# Patient Record
Sex: Female | Born: 1960 | Race: Black or African American | Hispanic: No | Marital: Single | Smoking: Current every day smoker
Health system: Southern US, Community
[De-identification: ages and names within clinical notes are randomized; demographics above are authoritative.]

## PROBLEM LIST (undated history)

## (undated) DIAGNOSIS — E559 Vitamin D deficiency, unspecified: Secondary | ICD-10-CM

## (undated) DIAGNOSIS — Z8619 Personal history of other infectious and parasitic diseases: Secondary | ICD-10-CM

## (undated) DIAGNOSIS — I10 Essential (primary) hypertension: Secondary | ICD-10-CM

## (undated) DIAGNOSIS — E785 Hyperlipidemia, unspecified: Secondary | ICD-10-CM

## (undated) HISTORY — DX: Vitamin D deficiency, unspecified: E55.9

## (undated) HISTORY — DX: Hyperlipidemia, unspecified: E78.5

## (undated) HISTORY — DX: Personal history of other infectious and parasitic diseases: Z86.19

---

## 2006-08-25 HISTORY — PX: ABDOMINAL HYSTERECTOMY: SHX81

## 2009-09-23 LAB — HM PAP SMEAR: HM Pap smear: NORMAL

## 2009-09-23 LAB — HM DEXA SCAN: HM Dexa Scan: NORMAL

## 2010-08-31 LAB — PULMONARY FUNCTION TEST

## 2011-09-22 ENCOUNTER — Emergency Department (INDEPENDENT_AMBULATORY_CARE_PROVIDER_SITE_OTHER): Payer: Managed Care, Other (non HMO)

## 2011-09-22 ENCOUNTER — Encounter (HOSPITAL_BASED_OUTPATIENT_CLINIC_OR_DEPARTMENT_OTHER): Payer: Self-pay | Admitting: *Deleted

## 2011-09-22 ENCOUNTER — Emergency Department (HOSPITAL_BASED_OUTPATIENT_CLINIC_OR_DEPARTMENT_OTHER)
Admission: EM | Admit: 2011-09-22 | Discharge: 2011-09-22 | Disposition: A | Discharge status: Home / Self Care | Payer: Managed Care, Other (non HMO) | Attending: Emergency Medicine | Admitting: Emergency Medicine

## 2011-09-22 DIAGNOSIS — S6290XA Unspecified fracture of unspecified wrist and hand, initial encounter for closed fracture: Secondary | ICD-10-CM | POA: Insufficient documentation

## 2011-09-22 DIAGNOSIS — S6990XA Unspecified injury of unspecified wrist, hand and finger(s), initial encounter: Secondary | ICD-10-CM

## 2011-09-22 DIAGNOSIS — S62609A Fracture of unspecified phalanx of unspecified finger, initial encounter for closed fracture: Secondary | ICD-10-CM

## 2011-09-22 DIAGNOSIS — W208XXA Other cause of strike by thrown, projected or falling object, initial encounter: Secondary | ICD-10-CM

## 2011-09-22 DIAGNOSIS — M79609 Pain in unspecified limb: Secondary | ICD-10-CM

## 2011-09-22 DIAGNOSIS — IMO0002 Reserved for concepts with insufficient information to code with codable children: Secondary | ICD-10-CM | POA: Insufficient documentation

## 2011-09-22 DIAGNOSIS — F17200 Nicotine dependence, unspecified, uncomplicated: Secondary | ICD-10-CM | POA: Insufficient documentation

## 2011-09-22 DIAGNOSIS — I10 Essential (primary) hypertension: Secondary | ICD-10-CM | POA: Insufficient documentation

## 2011-09-22 HISTORY — DX: Essential (primary) hypertension: I10

## 2011-09-22 MED ORDER — HYDROCHLOROTHIAZIDE 25 MG PO TABS
ORAL_TABLET | ORAL | Status: AC
  Filled 2011-09-22: qty 1

## 2011-09-22 MED ORDER — HYDROCODONE-ACETAMINOPHEN 5-325 MG PO TABS
2.0000 | ORAL_TABLET | Freq: Once | ORAL | Status: AC
  Administered 2011-09-22: 2 via ORAL

## 2011-09-22 MED ORDER — HYDROCODONE-ACETAMINOPHEN 5-325 MG PO TABS
ORAL_TABLET | ORAL | Status: AC
  Filled 2011-09-22: qty 2

## 2011-09-22 MED ORDER — HYDROCODONE-ACETAMINOPHEN 5-325 MG PO TABS: 2.0000 | ORAL_TABLET | Freq: Once | ORAL | Status: AC

## 2011-09-22 MED ORDER — HYDROCHLOROTHIAZIDE 25 MG PO TABS
25.0000 mg | ORAL_TABLET | Freq: Once | ORAL | Status: AC
  Administered 2011-09-22: 25 mg via ORAL

## 2011-09-22 MED ORDER — HYDROCHLOROTHIAZIDE 25 MG PO TABS: 25.0000 mg | ORAL_TABLET | Freq: Once | ORAL | Status: DC

## 2011-09-22 NOTE — ED Provider Notes (Signed)
History     CSN: [id number]  Arrival date & time 09/22/11  1712   First MD Initiated Contact with Patient 09/22/11 1739      No chief complaint on file.   (Consider location/radiation/quality/duration/timing/severity/associated sxs/prior treatment) Patient is a 51 y.o. female presenting with hand injury. The history is provided by the patient. No language interpreter was used.  Hand Injury  The incident occurred yesterday. The incident occurred at home. The injury mechanism was a direct blow. The pain is present in the right fingers and left fingers. The quality of the pain is described as throbbing. The pain is at a severity of 5/10. The pain is moderate. The pain has been constant since the incident. She reports no foreign bodies present. The symptoms are aggravated by movement. She has tried nothing for the symptoms.  Pt reports she slammed the car trunk on both of her hands.  Pt complains of pain in left and right fingers  Past Medical History  Diagnosis Date  . Hypertension     Past Surgical History  Procedure Date  . Abdominal hysterectomy     History reviewed. No pertinent family history.  History  Substance Use Topics  . Smoking status: Current Everyday Smoker  . Smokeless tobacco: Not on file  . Alcohol Use: 0.6 oz/week    1 Cans of beer per week     per day    OB History    Grav Para Term Preterm Abortions TAB SAB Ect Mult Living                  Review of Systems  Musculoskeletal: Positive for joint swelling.  Skin: Negative for wound.  All other systems reviewed and are negative.    Allergies  Review of patient's allergies indicates no known allergies.  Home Medications  No current outpatient prescriptions on file.  BP 200/105  Pulse 88  Temp(Src) 98.4 F (36.9 C) (Oral)  Resp 20  SpO2 100%  Physical Exam  Nursing note and vitals reviewed. Constitutional: She appears well-developed and well-nourished.  HENT:  Head: Normocephalic and  atraumatic.  Musculoskeletal: She exhibits edema and tenderness.       Swollen right 2nd and 3rd fingers,  Left hand  Tender 2nd,3rd and 4th fingers,  From all fingers,  nv and ns intact  Neurological: She is alert.  Skin: Skin is warm and dry.    ED Course  Procedures (including critical care time)  Labs Reviewed - No data to display Dg Hand Complete Left  09/22/2011  *RADIOLOGY REPORT*  Clinical Data: Slammed both hands in trunk of car yesterday  LEFT HAND - COMPLETE 3+ VIEW  Comparison: Right hand radiographs 09/22/2011  Findings: The joints of the hand are normally aligned.  Bony mineralization is normal.  No acute or healing fracture or radiopaque foreign body.  No discrete focal soft tissue swelling identified.  No significant degenerative change.  IMPRESSION: No evidence of acute bony trauma.  Original Report Authenticated By: Britta Mccreedy, M.D.   Dg Hand Complete Right  09/22/2011  *RADIOLOGY REPORT*  Clinical Data: Slammed both hands in a car trunk.  RIGHT HAND - COMPLETE 3+ VIEW  Comparison: Left hand radiographs peri  Findings: The joints are aligned.  Best seen on the lateral view are two small bony fragments dorsal to the distal interphalangeal joint of the second digit.  The largest of these bony fragments measures 3 mm.  Findings are suspicious for acute displaced fracture fragments, likely arising  from the base of the distal phalanx.  Suggest correlation with site of pain.  Also seen on the lateral view is a single 1.5 mm bony fragment along the dorsal aspect of the third distal interphalangeal joint, which is small fracture fragment cannot be excluded, likely arising from the base of the distal phalanx.  The remainder the bones appear intact.  No radiopaque foreign body is seen.  IMPRESSION:  1.  Findings suspicious for an acute displaced fracture of the second finger at the level of the DIP joint.  Fracture fragment is displaced dorsally, and favored to arise from the base of the  distal phalanx. 2. Probable acute tiny fracture fragment (versus degenerative change) at the level of the distal interphalangeal joint of the third finger.  Original Report Authenticated By: Britta Mccreedy, M.D.     No diagnosis found.    MDM  Pt placed in a splint.  I advised follow up with Dr. Pearletha Forge for recheck in 1 week.        Langston Masker, [place] 09/22/11 1901

## 2011-09-22 NOTE — ED Provider Notes (Signed)
Medical screening examination/treatment/procedure(s) were performed by non-physician practitioner and as supervising physician I was immediately available for consultation/collaboration.   Noam Karaffa, MD 09/22/11 2311 

## 2011-09-24 ENCOUNTER — Encounter: Payer: Self-pay | Admitting: Family

## 2011-09-24 ENCOUNTER — Ambulatory Visit (INDEPENDENT_AMBULATORY_CARE_PROVIDER_SITE_OTHER): Payer: Managed Care, Other (non HMO) | Admitting: Family

## 2011-09-24 VITALS — BP 170/100 | HR 90 | Temp 98.7°F | Resp 16

## 2011-09-24 DIAGNOSIS — S62609A Fracture of unspecified phalanx of unspecified finger, initial encounter for closed fracture: Secondary | ICD-10-CM

## 2011-09-24 DIAGNOSIS — I10 Essential (primary) hypertension: Secondary | ICD-10-CM | POA: Insufficient documentation

## 2011-09-24 DIAGNOSIS — H40009 Preglaucoma, unspecified, unspecified eye: Secondary | ICD-10-CM

## 2011-09-24 LAB — BASIC METABOLIC PANEL
BUN: 14 mg/dL (ref 6–23)
Calcium: 10.3 mg/dL (ref 8.4–10.5)
Chloride: 104 mEq/L (ref 96–112)
Creat: 1.14 mg/dL — ABNORMAL HIGH (ref 0.50–1.10)

## 2011-09-24 MED ORDER — OLMESARTAN MEDOXOMIL-HCTZ 20-12.5 MG PO TABS: 1.0000 | ORAL_TABLET | Freq: Every day | ORAL | Status: DC

## 2011-09-24 NOTE — Assessment & Plan Note (Signed)
BP Readings from Last 3 Encounters:  09/24/11 170/100  09/22/11 200/105  Blood pressure is actually improved from the reading in the ED but far from goal.  Will obtain bmet today for baseline renal function and change to benicar hct.  She was instructed on a low sodium diet and is instructed to follow up in 2 weeks.

## 2011-09-24 NOTE — Assessment & Plan Note (Signed)
She is scheduled to see orthopedics and I have encouraged her to keep this upcoming appointment.

## 2011-09-24 NOTE — Progress Notes (Signed)
Subjective:    Patient ID: Tiffany May, female    DOB: 25-May-1961, 51 y.o.   MRN: [id number]  HPI  Tiffany May is a 51 yr old female who presents today to establish care.  She was seen in the ED on 1/27 due to finger injury and was found to have elevated blood pressure.   1) HTN- reports that that she was diagnosed with HTN 2 yrs ago. She has not been treated for her HTN. She was place on HCTZ by ED- took dose this AM. She tries to keep low sodium diet.  Maternal GM, mother has HTN.   2) Finger Fractures- she reports that Shut her right index, middle finger in trunk on Sunday.  She was noted to have fractures of right second/third fingers at the level of DIP joints.  She will be seeing Dr. Ocie Cornfield- orthopedics.  3)Glaucoma- She reports that this is borderline, she sees an optometrist and is having her eyes checked every 2 yrs.    4) hx of tobacco abuse- reports that she quit smoking on "sunday."  She does smoke marijuana about once a week though.  Fibroid uterus/anemia- s/p partial hysterectomy in 2008.      Review of Systems  Constitutional: Negative for unexpected weight change.  HENT: Negative for congestion.   Eyes: Negative for visual disturbance.  Respiratory: Negative for cough and shortness of breath.   Cardiovascular: Negative for chest pain and leg swelling.  Gastrointestinal: Negative for nausea, vomiting and diarrhea.  Genitourinary: Positive for frequency. Negative for dysuria, hematuria, menstrual problem and dyspareunia.  Musculoskeletal: Negative for myalgias and arthralgias.  Skin: Negative for rash.  Neurological:       Rare headaches  Hematological: Negative for adenopathy.  Psychiatric/Behavioral:       Denies depression/anxiety   Past Medical History  Diagnosis Date  . Hypertension   . History of chicken pox   . Glaucoma     History   Social History  . Marital Status: Single    Spouse Name: N/A    Number of Children: 1  . Years of Education: N/A     Occupational History  .  [employer]   Social History Main Topics  . Smoking status: Former Smoker    Quit date: 09/23/2011  . Smokeless tobacco: Not on file   Comment: trying to quit  . Alcohol Use: 0.6 oz/week    1 Cans of beer per week     per day  . Drug Use: Yes    Special: Marijuana  . Sexually Active: Yes    Birth Control/ Protection: Surgical   Other Topics Concern  . Not on file   Social History Narrative   Caffeine Use:  1 soda dailyRegular exercise:  3 x weekly, walks dogs every day.Uses marijuana on weekends.She quit smoking cigarettes yesterday.  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 Abdominal hysterectomy 2008  . Cesarean section     Family History  Problem Relation Age of Onset  . Heart disease Mother     pacemaker  . Arthritis Maternal Grandmother   . Hypertension Maternal Grandmother   . Cancer Cousin     breast  . Cancer Cousin     colon  . Cancer Cousin     colon  . Cancer Cousin     colon    No Known Allergies  Current Outpatient Prescriptions on File Prior to Visit  Medication Sig Dispense Refill  . HYDROcodone-acetaminophen (NORCO) 5-325 MG per tablet Take 2 tablets by mouth once.  10 tablet  0    BP 170/100  Pulse 90  Temp(Src) 98.7 F (37.1 C) (Oral)  Resp 16  SpO2 98%       Objective:   Physical Exam  Constitutional: She appears well-developed and well-nourished. No distress.  HENT:  Head: Normocephalic and atraumatic.  Eyes: No scleral icterus.  Cardiovascular: Normal rate and regular rhythm.   No murmur heard. Pulmonary/Chest: Effort normal and breath sounds normal. No respiratory distress. She has no wheezes. She has no rales. She exhibits no tenderness.  Abdominal: Soft. Bowel sounds are normal. She exhibits no distension. There is no tenderness. There is no rebound and no guarding.  Musculoskeletal: She exhibits no edema.       R  second/third fingers are taped into splints.    Skin: Skin is warm and dry.  Psychiatric: She has a normal mood and affect. Her behavior is normal. Judgment and thought content normal.          Assessment & Plan:  Tobacco/Marijuana abuse-  I commended her on her recent smoking cessation.  I also recommended that she discontinue use of marijuana.

## 2011-09-24 NOTE — Assessment & Plan Note (Signed)
She tells me that she is having this routinely monitored.

## 2011-09-24 NOTE — Patient Instructions (Signed)
Stop HCTZ, start benicar HCT. Please follow up in 2 weeks.

## 2011-09-26 ENCOUNTER — Encounter: Payer: Self-pay | Admitting: Family Medicine

## 2011-09-26 ENCOUNTER — Ambulatory Visit (INDEPENDENT_AMBULATORY_CARE_PROVIDER_SITE_OTHER): Payer: Managed Care, Other (non HMO) | Admitting: Family Medicine

## 2011-09-26 VITALS — BP 156/91 | Ht 64.0 in | Wt 148.0 lb

## 2011-09-26 DIAGNOSIS — S62609A Fracture of unspecified phalanx of unspecified finger, initial encounter for closed fracture: Secondary | ICD-10-CM

## 2011-09-26 NOTE — Patient Instructions (Signed)
You have avulsion fractures of your 2nd and 3rd fingers of your right hand. It's VERY important to wear the splints at all times except to wash them at the sink as I showed you. Slide them off at the sink but keep finger extended. Take hydrocodone as needed for pain but no driving on this - can take tylenol during the day instead. Ok to take aleve as needed as well. Elevate above the level of your heart as much as possible to help with swelling. Follow up with me in 1 1/2 weeks for reevaluation - we will repeat x-rays at this time with your fingers extended. Work restrictions: do not use 2nd and 3rd fingers right hand - if unable to follow these restrictions, must be out of work. Ice areas as needed for 15 minutes at a time.

## 2011-09-29 ENCOUNTER — Encounter: Payer: Self-pay | Admitting: Family Medicine

## 2011-09-29 NOTE — Progress Notes (Signed)
Subjective:    Patient ID: Tiffany May, female    DOB: 06/04/1961, 51 y.o.   MRN: [id number]  PCP: Sandford Craze  HPI 51 yo F here with right hand injury.  Patient reports on 1/27 she accidentally had right hand slammed in car trunk. Significant pain in 2nd and 3rd digits along with swelling. Pain and swelling the worst in 2nd digit. She is right handed. Placed in finger splints with DIP in extension in ED after x-rays showed distal phalanx fractures. Having a lot of difficulty working - does a lot of typing. Taking Norco for pain. No prior finger fractures/injuries.  Past Medical History  Diagnosis Date  . Hypertension   . History of chicken pox   . Glaucoma     Current Outpatient Prescriptions on File Prior to Visit  Medication Sig Dispense Refill  . HYDROcodone-acetaminophen (NORCO) 5-325 MG per tablet Take 2 tablets by mouth once.  10 tablet  0  . olmesartan-hydrochlorothiazide (BENICAR HCT) 20-12.5 MG per tablet Take 1 tablet by mouth daily.  21 tablet  0    Past Surgical History  Procedure Date  . Abdominal hysterectomy 2008  . Cesarean section     No Known Allergies  History   Social History  . Marital Status: Single    Spouse Name: N/A    Number of Children: 1  . Years of Education: N/A   Occupational History  .  [employer]   Social History Main Topics  . Smoking status: Former Smoker    Quit date: 09/23/2011  . Smokeless tobacco: Not on file   Comment: trying to quit  . Alcohol Use: 0.6 oz/week    1 Cans of beer per week     per day  . Drug Use: Yes    Special: Marijuana  . Sexually Active: Yes    Birth Control/ Protection: Surgical   Other Topics Concern  . Not on file   Social History Narrative   Caffeine Use:  1 soda dailyRegular exercise:  3 x weekly, walks dogs every day.Uses marijuana on weekends.She quit smoking cigarettes yesterday.  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 Heart disease Mother     pacemaker  . Arthritis Maternal Grandmother   . Hypertension Maternal Grandmother   . Cancer Cousin     breast  . Cancer Cousin     colon  . Cancer Cousin     colon  . Cancer Cousin     colon    BP 156/91  Ht 5\' 4"  (1.626 m)  Wt 148 lb (67.132 kg)  BMI 25.40 kg/m2  Review of Systems See HPI above.    Objective:   Physical Exam Gen: NAD  R hand: Mod swelling and bruising distal 2nd digit, less swelling and no bruising 3rd digit. No malrotation or angulation. No subungual hematoma, lacerations. TTP dorsal aspect at DIP of 2nd and 3rd digits.  No other TTP throughout digits. Collateral ligaments intact of PIP and DIP joints. Able to flex and extend against resistance at PIP, DIP joints of 2nd and 3rd digits - pain on extension of DIP both digits. NVI distally with < 2 sec cap refill.     Assessment & Plan:  1. Right 2nd and 3rd distal phalanx fractures - radiographs reviewed, confirmed distal phalanx fractures.  2nd phalanx fracture fairly large but minimally displaced.  Start with conservative care for both with  mallet finger protocol.  Keep DIP in extension for next 6 weeks then nighttime splinting for 2 more weeks.  F/u at 2 week intervals to reevaluate, repeat radiographs.  Must keep DIP in extension for imaging as well.  Ok to return to work but must not use 2nd and 3rd digits of right hand - likely to have these restrictions for 6 weeks.  Rx vicodin to take as needed for pain - no driving or working on this med.  See instructions for further.

## 2011-09-29 NOTE — Assessment & Plan Note (Signed)
Right 2nd and 3rd distal phalanx fractures - radiographs reviewed, confirmed distal phalanx fractures.  2nd phalanx fracture fairly large but minimally displaced.  Start with conservative care for both with mallet finger protocol.  Keep DIP in extension for next 6 weeks then nighttime splinting for 2 more weeks.  F/u at 2 week intervals to reevaluate, repeat radiographs.  Must keep DIP in extension for imaging as well.  Ok to return to work but must not use 2nd and 3rd digits of right hand - likely to have these restrictions for 6 weeks.  Rx vicodin to take as needed for pain - no driving or working on this med.  See instructions for further.

## 2011-10-08 ENCOUNTER — Encounter: Payer: Self-pay | Admitting: Family

## 2011-10-08 ENCOUNTER — Ambulatory Visit (HOSPITAL_BASED_OUTPATIENT_CLINIC_OR_DEPARTMENT_OTHER)
Admission: RE | Admit: 2011-10-08 | Discharge: 2011-10-08 | Disposition: A | Discharge status: Home / Self Care | Payer: Managed Care, Other (non HMO) | Source: Ambulatory Visit | Attending: Family Medicine | Admitting: Family Medicine

## 2011-10-08 ENCOUNTER — Ambulatory Visit (INDEPENDENT_AMBULATORY_CARE_PROVIDER_SITE_OTHER): Payer: Managed Care, Other (non HMO) | Admitting: Family Medicine

## 2011-10-08 ENCOUNTER — Ambulatory Visit (INDEPENDENT_AMBULATORY_CARE_PROVIDER_SITE_OTHER): Payer: Managed Care, Other (non HMO) | Admitting: Family

## 2011-10-08 ENCOUNTER — Encounter: Payer: Self-pay | Admitting: Family Medicine

## 2011-10-08 VITALS — BP 152/90 | HR 81 | Temp 97.6°F | Ht 65.0 in | Wt 148.0 lb

## 2011-10-08 VITALS — BP 148/86 | HR 82 | Temp 98.6°F | Resp 18 | Wt 149.1 lb

## 2011-10-08 DIAGNOSIS — Z4789 Encounter for other orthopedic aftercare: Secondary | ICD-10-CM | POA: Insufficient documentation

## 2011-10-08 DIAGNOSIS — S62609A Fracture of unspecified phalanx of unspecified finger, initial encounter for closed fracture: Secondary | ICD-10-CM

## 2011-10-08 DIAGNOSIS — I10 Essential (primary) hypertension: Secondary | ICD-10-CM

## 2011-10-08 DIAGNOSIS — X58XXXA Exposure to other specified factors, initial encounter: Secondary | ICD-10-CM

## 2011-10-08 DIAGNOSIS — S62639A Displaced fracture of distal phalanx of unspecified finger, initial encounter for closed fracture: Secondary | ICD-10-CM

## 2011-10-08 DIAGNOSIS — Z09 Encounter for follow-up examination after completed treatment for conditions other than malignant neoplasm: Secondary | ICD-10-CM

## 2011-10-08 MED ORDER — OLMESARTAN-AMLODIPINE-HCTZ 20-5-12.5 MG PO TABS: 1.0000 | ORAL_TABLET | Freq: Every day | ORAL | Status: DC

## 2011-10-08 NOTE — Progress Notes (Signed)
  Subjective:    Patient ID: Tiffany May, female    DOB: [birth year], 51 y.o.   MRN: [id number]  HPI  Ms.  May presents today for followup of hypertension. Patient has been treated for Chronic HTN for quite some time. She is currently on benicar HCT. No associated S/S related to HTN.   Quality: chronic Modifying factor: meds Duration: Quite sometime Associated S/S: None.  The patient denies the following associated symptoms: Chest pain, dyspnea, blurred vision, headache, or lower extremity edema.      Review of Systems See HPI  Past Medical History  Diagnosis Date  . Hypertension   . History of chicken pox   . Glaucoma     History   Social History  . Marital Status: Single    Spouse Name: N/A    Number of Children: 1  . Years of Education: N/A   Occupational History  .  [employer]   Social History Main Topics  . Smoking status: Former Smoker    Quit date: 09/23/2011  . Smokeless tobacco: Not on file   Comment: trying to quit  . Alcohol Use: 0.6 oz/week    1 Cans of beer per week     per day  . Drug Use: Yes    Special: Marijuana  . Sexually Active: Yes    Birth Control/ Protection: Surgical   Other Topics Concern  . Not on file   Social History Narrative   Caffeine Use:  1 soda dailyRegular exercise:  3 x weekly, walks dogs every day.Uses marijuana on weekends.She quit smoking cigarettes yesterday.  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 Abdominal hysterectomy 2008  . Cesarean section     Family History  Problem Relation Age of Onset  . Heart disease Mother     pacemaker  . Arthritis Maternal Grandmother   . Hypertension Maternal Grandmother   . Cancer Cousin     breast  . Cancer Cousin     colon  . Cancer Cousin     colon  . Cancer Cousin     colon    No Known Allergies  Current Outpatient Prescriptions on File Prior to Visit  Medication Sig Dispense Refill    . olmesartan-hydrochlorothiazide (BENICAR HCT) 20-12.5 MG per tablet Take 1 tablet by mouth daily.  21 tablet  0    BP 148/86  Pulse 82  Temp(Src) 98.6 F (37 C) (Oral)  Resp 18  Wt 149 lb 1.3 oz (67.622 kg)  SpO2 96%       Objective:   Physical Exam  Constitutional: She appears well-developed and well-nourished. No distress.  Cardiovascular: Normal rate and regular rhythm.   No murmur heard. Musculoskeletal: She exhibits no edema.  Psychiatric: She has a normal mood and affect. Her behavior is normal. Judgment and thought content normal.          Assessment & Plan:

## 2011-10-08 NOTE — Assessment & Plan Note (Addendum)
BP Readings from Last 3 Encounters:  10/08/11 148/86  10/08/11 152/90  09/26/11 156/91   BP prior to addition of benicar was 170/100.  Improving, but not yet at goal Change benicar HCT to tribenzor. Follow up in 1 month.  Obtain bmet.

## 2011-10-08 NOTE — Patient Instructions (Addendum)
Please complete lab work prior to leaving. Follow up in 1 month.  

## 2011-10-09 LAB — BASIC METABOLIC PANEL
Calcium: 10.3 mg/dL (ref 8.4–10.5)
Glucose, Bld: 91 mg/dL (ref 70–99)
Potassium: 4 mEq/L (ref 3.5–5.3)
Sodium: 142 mEq/L (ref 135–145)

## 2011-10-10 ENCOUNTER — Encounter: Payer: Self-pay | Admitting: Family

## 2011-10-10 ENCOUNTER — Encounter: Payer: Self-pay | Admitting: Family Medicine

## 2011-10-10 NOTE — Assessment & Plan Note (Signed)
repeat radiographs done - no additional displacement and patient has been compliant with extension splinting.  Will continue with this and current restrictions (no use of 2nd, 3rd digits right hand) until 6 weeks out then expect nighttime splinting for 2 additional weeks.  Icing, vicodin, aleve as needed.  F/u in 2 weeks - will repeat exam and x-rays.

## 2011-10-10 NOTE — Progress Notes (Signed)
Subjective:    Patient ID: Tiffany May, female    DOB: 1961-03-25, 51 y.o.   MRN: [id number]  PCP: Sandford Craze  HPI  51 yo F here for f/u right hand injury.  2/1: Patient reports on 1/27 she accidentally had right hand slammed in car trunk. Significant pain in 2nd and 3rd digits along with swelling. Pain and swelling the worst in 2nd digit. She is right handed. Placed in finger splints with DIP in extension in ED after x-rays showed distal phalanx fractures. Having a lot of difficulty working - does a lot of typing. Taking Norco for pain. No prior finger fractures/injuries.  2/13: Patient has been compliant with extension splinting of DIPs right 2nd and 3rd digits. Taking hydrocodone for pain as well as aleve. Elevating as much as possible. Not icing. No other complaints.  Past Medical History  Diagnosis Date  . Hypertension   . History of chicken pox   . Glaucoma     Current Outpatient Prescriptions on File Prior to Visit  Medication Sig Dispense Refill  . HYDROcodone-acetaminophen (VICODIN) 5-500 MG per tablet Take 1 tablet by mouth daily as needed.      . Olmesartan-Amlodipine-HCTZ (TRIBENZOR) 20-5-12.5 MG TABS Take 1 tablet by mouth daily.  30 tablet  0  . olmesartan-hydrochlorothiazide (BENICAR HCT) 20-12.5 MG per tablet Take 1 tablet by mouth daily.  21 tablet  0    Past Surgical History  Procedure Date  . Abdominal hysterectomy 2008  . Cesarean section     No Known Allergies  History   Social History  . Marital Status: Single    Spouse Name: N/A    Number of Children: 1  . Years of Education: N/A   Occupational History  .  [employer]   Social History Main Topics  . Smoking status: Former Smoker    Quit date: 09/23/2011  . Smokeless tobacco: Not on file   Comment: trying to quit  . Alcohol Use: 0.6 oz/week    1 Cans of beer per week     per day  . Drug Use: Yes    Special: Marijuana  . Sexually Active: Yes    Birth Control/ Protection:  Surgical   Other Topics Concern  . Not on file   Social History Narrative   Caffeine Use:  1 soda dailyRegular exercise:  3 x weekly, walks dogs every day.Uses marijuana on weekends.She quit smoking cigarettes yesterday.  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 Heart disease Mother     pacemaker  . Arthritis Maternal Grandmother   . Hypertension Maternal Grandmother   . Cancer Cousin     breast  . Cancer Cousin     colon  . Cancer Cousin     colon  . Cancer Cousin     colon    BP 152/90  Pulse 81  Temp(Src) 97.6 F (36.4 C) (Oral)  Ht 5\' 5"  (1.651 m)  Wt 148 lb (67.132 kg)  BMI 24.63 kg/m2  Review of Systems  See HPI above.    Objective:   Physical Exam  Gen: NAD  R hand: Mild swelling and bruising distal 2nd digit, less swelling and no bruising 3rd digit. No malrotation or angulation. No subungual hematoma, lacerations. TTP dorsal aspect at DIP of 2nd and 3rd digits.  No other TTP throughout digits. Able to flex and extend against resistance at PIP, DIP joints of 2nd  and 3rd digits - pain on extension of DIP both digits. NVI distally with < 2 sec cap refill.     Assessment & Plan:  1. Right 2nd and 3rd distal phalanx fractures - repeat radiographs done - no additional displacement and patient has been compliant with extension splinting.  Will continue with this and current restrictions (no use of 2nd, 3rd digits right hand) until 6 weeks out then expect nighttime splinting for 2 additional weeks.  Icing, vicodin, aleve as needed.  F/u in 2 weeks - will repeat exam and x-rays.

## 2011-10-22 ENCOUNTER — Ambulatory Visit: Payer: Managed Care, Other (non HMO) | Admitting: Family Medicine

## 2011-10-23 ENCOUNTER — Encounter: Payer: Self-pay | Admitting: Family Medicine

## 2011-10-23 ENCOUNTER — Ambulatory Visit (HOSPITAL_BASED_OUTPATIENT_CLINIC_OR_DEPARTMENT_OTHER)
Admission: RE | Admit: 2011-10-23 | Discharge: 2011-10-23 | Disposition: A | Discharge status: Home / Self Care | Payer: Managed Care, Other (non HMO) | Source: Ambulatory Visit | Attending: Family Medicine | Admitting: Family Medicine

## 2011-10-23 ENCOUNTER — Ambulatory Visit (INDEPENDENT_AMBULATORY_CARE_PROVIDER_SITE_OTHER): Payer: Managed Care, Other (non HMO) | Admitting: Family Medicine

## 2011-10-23 ENCOUNTER — Other Ambulatory Visit: Payer: Self-pay | Admitting: Family Medicine

## 2011-10-23 VITALS — BP 145/65 | HR 90 | Temp 98.2°F | Ht 65.0 in | Wt 140.0 lb

## 2011-10-23 DIAGNOSIS — M79643 Pain in unspecified hand: Secondary | ICD-10-CM

## 2011-10-23 DIAGNOSIS — X58XXXA Exposure to other specified factors, initial encounter: Secondary | ICD-10-CM

## 2011-10-23 DIAGNOSIS — S62639A Displaced fracture of distal phalanx of unspecified finger, initial encounter for closed fracture: Secondary | ICD-10-CM

## 2011-10-23 DIAGNOSIS — M79609 Pain in unspecified limb: Secondary | ICD-10-CM

## 2011-10-23 DIAGNOSIS — S62609A Fracture of unspecified phalanx of unspecified finger, initial encounter for closed fracture: Secondary | ICD-10-CM

## 2011-10-23 DIAGNOSIS — Z4789 Encounter for other orthopedic aftercare: Secondary | ICD-10-CM | POA: Insufficient documentation

## 2011-10-23 DIAGNOSIS — Z09 Encounter for follow-up examination after completed treatment for conditions other than malignant neoplasm: Secondary | ICD-10-CM

## 2011-10-23 NOTE — Progress Notes (Signed)
Subjective:    Patient ID: Tiffany May, female    DOB: February 08, 1961, 51 y.o.   MRN: [id number]  PCP: Sandford Craze  HPI  51 yo F here for f/u right hand injury.  2/1: Patient reports on 1/27 she accidentally had right hand slammed in car trunk. Significant pain in 2nd and 3rd digits along with swelling. Pain and swelling the worst in 2nd digit. She is right handed. Placed in finger splints with DIP in extension in ED after x-rays showed distal phalanx fractures. Having a lot of difficulty working - does a lot of typing. Taking Norco for pain. No prior finger fractures/injuries.  2/13: Patient has been compliant with extension splinting of DIPs right 2nd and 3rd digits. Taking hydrocodone for pain as well as aleve. Elevating as much as possible. Not icing. No other complaints.  2/28: Patient now 4 1/2 weeks out from initial injury. Has been compliant with splinting of DIPs 2nd and 3rd digits. Still with throbbing in 2nd digit though 3rd digit feels a lot better. Swelling improved, fingernail looks like it's coming off 2nd digit. Needs refill on hydrocodone. Otherwise no complaints. Continues to be out of work.  Past Medical History  Diagnosis Date  . Hypertension   . History of chicken pox   . Glaucoma     Current Outpatient Prescriptions on File Prior to Visit  Medication Sig Dispense Refill  . HYDROcodone-acetaminophen (VICODIN) 5-500 MG per tablet Take 1 tablet by mouth daily as needed.      . Olmesartan-Amlodipine-HCTZ (TRIBENZOR) 20-5-12.5 MG TABS Take 1 tablet by mouth daily.  30 tablet  0  . olmesartan-hydrochlorothiazide (BENICAR HCT) 20-12.5 MG per tablet Take 1 tablet by mouth daily.  21 tablet  0    Past Surgical History  Procedure Date  . Abdominal hysterectomy 2008  . Cesarean section     No Known Allergies  History   Social History  . Marital Status: Single    Spouse Name: N/A    Number of Children: 1  . Years of Education: N/A    Occupational History  .  [employer]   Social History Main Topics  . Smoking status: Former Smoker    Quit date: 09/23/2011  . Smokeless tobacco: Not on file   Comment: trying to quit  . Alcohol Use: 0.6 oz/week    1 Cans of beer per week     per day  . Drug Use: Yes    Special: Marijuana  . Sexually Active: Yes    Birth Control/ Protection: Surgical   Other Topics Concern  . Not on file   Social History Narrative   Caffeine Use:  1 soda dailyRegular exercise:  3 x weekly, walks dogs every day.Uses marijuana on weekends.She quit smoking cigarettes yesterday.  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 Heart disease Mother     pacemaker  . Arthritis Maternal Grandmother   . Hypertension Maternal Grandmother   . Cancer Cousin     breast  . Cancer Cousin     colon  . Cancer Cousin     colon  . Cancer Cousin     colon    BP 145/65  Pulse 90  Temp(Src) 98.2 F (36.8 C) (Oral)  Ht 5\' 5"  (1.651 m)  Wt 140 lb (63.504 kg)  BMI 23.30 kg/m2  Review of Systems  See HPI above.    Objective:   Physical  Exam  Gen: NAD  R hand: Mild swelling over DIP dorsally of 2nd digit, no swelling or bruising 3rd digit. No malrotation or angulation. No subungual hematoma, lacerations - nail of 2nd digit detached at base. TTP dorsal aspect at DIP of 2nd digit, minimal tenderness DIP 3rd digit.  No other TTP throughout digits. Able to extend against resistance at PIP, DIP joints of 2nd and 3rd digits - pain on extension of DIP 2nd digit > 3rd digit. NVI distally with < 2 sec cap refill.     Assessment & Plan:  1. Right 2nd and 3rd distal phalanx fractures - repeat radiographs done - no real change though no additional displacement.  Clinically 3rd digit has improved a lot while only mild improvement 2nd digit.  Radiology brought up possibility this bony fragment dorsal DIP 2nd digit could be from a  remote injury but patient does not recall one.  Advised patient would continue extension splinting at least 2 more weeks of DIP 2nd and 3rd digits - hopeful can transition to nighttime splinting for 2 more weeks after that.  Icing, elevation, rx refill vicodin #60.  If she does not improve after 2-4 more weeks may need referral to hand surgeon.  Continues to be out of work - anticipate this at least 4 more weeks (no work available where can't use 2nd, 3rd digits right hand).  F/u 2 weeks for repeat eval and radiographs.

## 2011-10-23 NOTE — Assessment & Plan Note (Signed)
Right 2nd and 3rd distal phalanx fractures - repeat radiographs done - no real change though no additional displacement.  Clinically 3rd digit has improved a lot while only mild improvement 2nd digit.  Radiology brought up possibility this bony fragment dorsal DIP 2nd digit could be from a remote injury but patient does not recall one.  Advised patient would continue extension splinting at least 2 more weeks of DIP 2nd and 3rd digits - hopeful can transition to nighttime splinting for 2 more weeks after that.  Icing, elevation, rx refill vicodin #60.  If she does not improve after 2-4 more weeks may need referral to hand surgeon.  Continues to be out of work - anticipate this at least 4 more weeks (no work available where can't use 2nd, 3rd digits right hand).  F/u 2 weeks for repeat eval and radiographs.

## 2011-11-06 ENCOUNTER — Encounter: Payer: Self-pay | Admitting: Family Medicine

## 2011-11-06 ENCOUNTER — Ambulatory Visit (HOSPITAL_BASED_OUTPATIENT_CLINIC_OR_DEPARTMENT_OTHER)
Admission: RE | Admit: 2011-11-06 | Discharge: 2011-11-06 | Disposition: A | Discharge status: Home / Self Care | Payer: Managed Care, Other (non HMO) | Source: Ambulatory Visit | Attending: Family Medicine | Admitting: Family Medicine

## 2011-11-06 ENCOUNTER — Ambulatory Visit (INDEPENDENT_AMBULATORY_CARE_PROVIDER_SITE_OTHER): Payer: Managed Care, Other (non HMO) | Admitting: Family Medicine

## 2011-11-06 VITALS — BP 162/84 | HR 80 | Temp 97.8°F | Ht 65.0 in | Wt 140.0 lb

## 2011-11-06 DIAGNOSIS — S62609A Fracture of unspecified phalanx of unspecified finger, initial encounter for closed fracture: Secondary | ICD-10-CM

## 2011-11-06 DIAGNOSIS — S6990XA Unspecified injury of unspecified wrist, hand and finger(s), initial encounter: Secondary | ICD-10-CM

## 2011-11-06 DIAGNOSIS — M79609 Pain in unspecified limb: Secondary | ICD-10-CM

## 2011-11-06 DIAGNOSIS — M25549 Pain in joints of unspecified hand: Secondary | ICD-10-CM | POA: Insufficient documentation

## 2011-11-06 DIAGNOSIS — W19XXXA Unspecified fall, initial encounter: Secondary | ICD-10-CM

## 2011-11-06 NOTE — Progress Notes (Signed)
Subjective:    Patient ID: Tiffany May, female    DOB: 20-Dec-1960, 51 y.o.   MRN: [id number]  PCP: Sandford Craze  HPI  51 yo F here for f/u right hand injury.  2/1: Patient reports on 1/27 she accidentally had right hand slammed in car trunk. Significant pain in 2nd and 3rd digits along with swelling. Pain and swelling the worst in 2nd digit. She is right handed. Placed in finger splints with DIP in extension in ED after x-rays showed distal phalanx fractures. Having a lot of difficulty working - does a lot of typing. Taking Norco for pain. No prior finger fractures/injuries.  2/13: Patient has been compliant with extension splinting of DIPs right 2nd and 3rd digits. Taking hydrocodone for pain as well as aleve. Elevating as much as possible. Not icing. No other complaints.  2/28: Patient now 4 1/2 weeks out from initial injury. Has been compliant with splinting of DIPs 2nd and 3rd digits. Still with throbbing in 2nd digit though 3rd digit feels a lot better. Swelling improved, fingernail looks like it's coming off 2nd digit. Needs refill on hydrocodone. Otherwise no complaints. Continues to be out of work.  3/14: Patient reports pain in 3rd digit is gone. Has been compliant with splinting. Continues with pain (though much improved from 6 weeks ago) in 2nd DIP/distal phalanx. Takes hydrocodone at bedtime as needed. Out of work.  Past Medical History  Diagnosis Date  . Hypertension   . History of chicken pox   . Glaucoma     Current Outpatient Prescriptions on File Prior to Visit  Medication Sig Dispense Refill  . HYDROcodone-acetaminophen (VICODIN) 5-500 MG per tablet Take 1 tablet by mouth daily as needed.      . Olmesartan-Amlodipine-HCTZ (TRIBENZOR) 20-5-12.5 MG TABS Take 1 tablet by mouth daily.  30 tablet  0  . olmesartan-hydrochlorothiazide (BENICAR HCT) 20-12.5 MG per tablet Take 1 tablet by mouth daily.  21 tablet  0    Past Surgical History    Procedure Date  . Abdominal hysterectomy 2008  . Cesarean section     No Known Allergies  History   Social History  . Marital Status: Single    Spouse Name: N/A    Number of Children: 1  . Years of Education: N/A   Occupational History  .  [employer]   Social History Main Topics  . Smoking status: Former Smoker    Quit date: 09/23/2011  . Smokeless tobacco: Not on file   Comment: trying to quit  . Alcohol Use: 0.6 oz/week    1 Cans of beer per week     per day  . Drug Use: Yes    Special: Marijuana  . Sexually Active: Yes    Birth Control/ Protection: Surgical   Other Topics Concern  . Not on file   Social History Narrative   Caffeine Use:  1 soda dailyRegular exercise:  3 x weekly, walks dogs every day.Uses marijuana on weekends.She quit smoking cigarettes yesterday.  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 Heart disease Mother     pacemaker  . Arthritis Maternal Grandmother   . Hypertension Maternal Grandmother   . Cancer Cousin     breast  . Cancer Cousin     colon  . Cancer Cousin     colon  . Cancer Cousin     colon    BP 162/84  Pulse  80  Temp(Src) 97.8 F (36.6 C) (Oral)  Ht 5\' 5"  (1.651 m)  Wt 140 lb (63.504 kg)  BMI 23.30 kg/m2  Review of Systems  See HPI above.    Objective:   Physical Exam  Gen: NAD  R hand: Mild swelling over DIP dorsally of 2nd digit, no swelling or bruising 3rd digit. No malrotation or angulation. No subungual hematoma, lacerations - nail of 2nd digit detached at base. Mild TTP dorsal aspect at DIP of 2nd digit, no tenderness DIP 3rd digit.  No other TTP throughout digits. Able to extend against resistance at PIP, DIP joints of 2nd and 3rd digits - mild pain on extension of DIP 2nd digit. NVI distally with < 2 sec cap refill.     Assessment & Plan:  1. Right 2nd and 3rd distal phalanx fractures - discussed possibility of  these being old injuries and she states she's never had injury to either of these fingers to suggest this.  Repeat radiographs done without change though 3rd digit clinically improved.  Mostly improved 2nd digit as well but still bad enough that she requires hydrocodone at night.  Will go out to 8 total weeks extension splinting 2nd DIP and if still symptomatic, will consider referral to hand surgeon to discuss possible operative intervention vs continued splinting.  Switch to nighttime splinting 3rd DIP for 2 weeks.  F/u in 2 weeks, repeat radiographs.

## 2011-11-06 NOTE — Assessment & Plan Note (Signed)
Right 2nd and 3rd distal phalanx fractures - discussed possibility of these being old injuries and she states she's never had injury to either of these fingers to suggest this.  Repeat radiographs done without change though 3rd digit clinically improved.  Mostly improved 2nd digit as well but still bad enough that she requires hydrocodone at night.  Will go out to 8 total weeks extension splinting 2nd DIP and if still symptomatic, will consider referral to hand surgeon to discuss possible operative intervention vs continued splinting.  Switch to nighttime splinting 3rd DIP for 2 weeks.  F/u in 2 weeks, repeat radiographs.

## 2011-11-06 NOTE — Patient Instructions (Signed)
Your middle finger has healed up really well though I can still see the fleck of bone, this has likely scarred down - continue nighttime splinting for 2 more weeks on this finger. Keep your index finger splinted as you have been for 2 more weeks. If you get out to 8 weeks and this one is still giving you a lot of trouble, I would have you see the hand surgeon for their opinion. Continue medications, icing as you have been. Out of work at least two weeks - if this is still bothering you and we have to send you to the hand surgeon, you will likely be out through that appointment as well. Follow up with me in 2 weeks.

## 2011-11-07 ENCOUNTER — Ambulatory Visit (INDEPENDENT_AMBULATORY_CARE_PROVIDER_SITE_OTHER): Payer: Managed Care, Other (non HMO) | Admitting: Family

## 2011-11-07 ENCOUNTER — Encounter: Payer: Self-pay | Admitting: Family

## 2011-11-07 VITALS — BP 130/80 | HR 72 | Temp 98.0°F | Resp 16 | Ht 65.0 in | Wt 147.1 lb

## 2011-11-07 DIAGNOSIS — I10 Essential (primary) hypertension: Secondary | ICD-10-CM

## 2011-11-07 MED ORDER — OLMESARTAN-AMLODIPINE-HCTZ 20-5-12.5 MG PO TABS: 1.0000 | ORAL_TABLET | Freq: Every day | ORAL | Status: DC

## 2011-11-07 NOTE — Patient Instructions (Signed)
Please follow up in 3 months.  

## 2011-11-07 NOTE — Progress Notes (Signed)
  Subjective:    Patient ID: Tiffany May, female    DOB: [birth year], 51 y.o.   MRN: [id number]  HPI  Ms.  Tiffany May is a 51 yr old female who presents today for follow up of her HTN.  Last visit her blood pressure was above goal on benicar hct.  She was transitioned to tribenzor.  She reports that she is tolerating the medication without difficulty. Denies CP/SOB/swelling.    Review of Systems See HPI    Objective:   Physical Exam  Constitutional: She appears well-developed and well-nourished. No distress.  Cardiovascular: Normal rate and regular rhythm.   No murmur heard. Pulmonary/Chest: Effort normal and breath sounds normal. No respiratory distress. She has no wheezes. She has no rales. She exhibits no tenderness.  Musculoskeletal: She exhibits no edema.  Psychiatric: She has a normal mood and affect. Her behavior is normal. Judgment and thought content normal.          Assessment & Plan:

## 2011-11-07 NOTE — Assessment & Plan Note (Signed)
BP looks good on current dose of tribenzor. Will continue current dose.  Pt instructed to follow up in 3 months.

## 2011-11-20 ENCOUNTER — Ambulatory Visit (HOSPITAL_BASED_OUTPATIENT_CLINIC_OR_DEPARTMENT_OTHER)
Admission: RE | Admit: 2011-11-20 | Discharge: 2011-11-20 | Disposition: A | Discharge status: Home / Self Care | Payer: Managed Care, Other (non HMO) | Source: Ambulatory Visit | Attending: Family Medicine | Admitting: Family Medicine

## 2011-11-20 ENCOUNTER — Encounter: Payer: Self-pay | Admitting: Family Medicine

## 2011-11-20 ENCOUNTER — Ambulatory Visit (INDEPENDENT_AMBULATORY_CARE_PROVIDER_SITE_OTHER): Payer: Managed Care, Other (non HMO) | Admitting: Family Medicine

## 2011-11-20 VITALS — BP 135/78 | HR 67 | Temp 98.3°F | Ht 65.0 in | Wt 140.0 lb

## 2011-11-20 DIAGNOSIS — X58XXXA Exposure to other specified factors, initial encounter: Secondary | ICD-10-CM | POA: Insufficient documentation

## 2011-11-20 DIAGNOSIS — S6990XA Unspecified injury of unspecified wrist, hand and finger(s), initial encounter: Secondary | ICD-10-CM | POA: Insufficient documentation

## 2011-11-20 DIAGNOSIS — S6991XA Unspecified injury of right wrist, hand and finger(s), initial encounter: Secondary | ICD-10-CM

## 2011-11-20 DIAGNOSIS — S62609A Fracture of unspecified phalanx of unspecified finger, initial encounter for closed fracture: Secondary | ICD-10-CM

## 2011-11-20 DIAGNOSIS — Z09 Encounter for follow-up examination after completed treatment for conditions other than malignant neoplasm: Secondary | ICD-10-CM

## 2011-11-20 NOTE — Patient Instructions (Signed)
We are going to refer you to a hand surgeon for their opinion regarding your index finger and these bone fragments. You've been fully treated for a mallet type injury with avulsion fracture for 8 weeks with extension splinting. While these bone fragments may be old, I would expect you to be healed at least most of the way clinically by now. Continue with extension splinting and current work restrictions - defer further restrictions, workup, possible surgery/splinting/OT to the hand group. Your middle finger mallet injury has healed clinically. Follow up with me as needed.

## 2011-11-20 NOTE — Progress Notes (Signed)
Subjective:    Patient ID: Tiffany May, female    DOB: Jun 22, 1961, 51 y.o.   MRN: [id number]  PCP: Sandford Craze  HPI  51 yo F here for f/u right hand injury.  2/1: Patient reports on 1/27 she accidentally had right hand slammed in car trunk. Significant pain in 2nd and 3rd digits along with swelling. Pain and swelling the worst in 2nd digit. She is right handed. Placed in finger splints with DIP in extension in ED after x-rays showed distal phalanx fractures. Having a lot of difficulty working - does a lot of typing. Taking Norco for pain. No prior finger fractures/injuries.  2/13: Patient has been compliant with extension splinting of DIPs right 2nd and 3rd digits. Taking hydrocodone for pain as well as aleve. Elevating as much as possible. Not icing. No other complaints.  2/28: Patient now 4 1/2 weeks out from initial injury. Has been compliant with splinting of DIPs 2nd and 3rd digits. Still with throbbing in 2nd digit though 3rd digit feels a lot better. Swelling improved, fingernail looks like it's coming off 2nd digit. Needs refill on hydrocodone. Otherwise no complaints. Continues to be out of work.  3/14: Patient reports pain in 3rd digit is gone. Has been compliant with splinting. Continues with pain (though much improved from 6 weeks ago) in 2nd DIP/distal phalanx. Takes hydrocodone at bedtime as needed. Out of work.  3/28: Patient reports middle finger is completely healed - finished nighttime splinting for 2 weeks after 6 weeks of extension splinting. 2nd digit still painful around DIP joint dorsally.  Compliant with extension splinting now for 8 weeks. Hydrocodone at bedtime as needed. Out of work as nothing available that allows her to work without use of right hand.  Past Medical History  Diagnosis Date  . Hypertension   . History of chicken pox   . Glaucoma     Current Outpatient Prescriptions on File Prior to Visit  Medication Sig  Dispense Refill  . HYDROcodone-acetaminophen (VICODIN) 5-500 MG per tablet Take 1 tablet by mouth daily as needed.      . Olmesartan-Amlodipine-HCTZ (TRIBENZOR) 20-5-12.5 MG TABS Take 1 tablet by mouth daily.  30 tablet  1  . DISCONTD: olmesartan-hydrochlorothiazide (BENICAR HCT) 20-12.5 MG per tablet Take 1 tablet by mouth daily.  21 tablet  0    Past Surgical History  Procedure Date  . Abdominal hysterectomy 2008  . Cesarean section     No Known Allergies  History   Social History  . Marital Status: Single    Spouse Name: N/A    Number of Children: 1  . Years of Education: N/A   Occupational History  .  [employer]   Social History Main Topics  . Smoking status: Former Smoker    Quit date: 09/23/2011  . Smokeless tobacco: Not on file   Comment: 5 cigarettes daily; trying to quit  . Alcohol Use: 0.6 oz/week    1 Cans of beer per week     per day  . Drug Use: Yes    Special: Marijuana  . Sexually Active: Yes    Birth Control/ Protection: Surgical   Other Topics Concern  . Not on file   Social History Narrative   Caffeine Use:  1 soda dailyRegular exercise:  3 x weekly, walks dogs every day.Uses marijuana on weekends.She quit smoking cigarettes yesterday.  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  Heart disease Mother     pacemaker  . Arthritis Maternal Grandmother   . Hypertension Maternal Grandmother   . Cancer Cousin     breast  . Cancer Cousin     colon  . Cancer Cousin     colon  . Cancer Cousin     colon    BP 135/78  Pulse 67  Temp(Src) 98.3 F (36.8 C) (Oral)  Ht 5\' 5"  (1.651 m)  Wt 140 lb (63.504 kg)  BMI 23.30 kg/m2  Review of Systems  See HPI above.    Objective:   Physical Exam  Gen: NAD  R hand: Mild swelling over DIP dorsally of 2nd digit, no swelling or bruising 3rd digit. No malrotation or angulation. No subungual hematoma, lacerations - nail of  2nd digit detached at base but still hasn't fallen off. Mild TTP dorsal aspect at DIP of 2nd digit, no tenderness DIP 3rd digit.  No other TTP throughout digits. Able to extend against resistance at PIP, DIP joints of 2nd and 3rd digits - still with mild pain on extension of DIP 2nd digit, 4/5 strength extension at DIP of 2nd digit. NVI distally with < 2 sec cap refill.     Assessment & Plan:  1. Right 2nd and 3rd distal phalanx fractures - 3rd mallet finger injury with avulsion is healed clinically and radiographically.  However, 2nd digit still painful with mild weakness on extension at DIP despite 8 weeks extension splinting, rest, pain medication.  As noted previously we discussed possibility of this being due to an old injury and she stated she's never had injury to 2nd digit to suggest this.  Repeat radiographs done without change in 2nd distal phalanx fragments.  Will refer at this point to a hand surgeon for further recommendations which may include further splinting, OT, operative intervention.  Continue current work restrictions in the meantime.

## 2011-11-20 NOTE — Assessment & Plan Note (Signed)
Right 2nd and 3rd distal phalanx fractures - 3rd mallet finger injury with avulsion is healed clinically and radiographically.  However, 2nd digit still painful with mild weakness on extension at DIP despite 8 weeks extension splinting, rest, pain medication.  As noted previously we discussed possibility of this being due to an old injury and she stated she's never had injury to 2nd digit to suggest this.  Repeat radiographs done without change in 2nd distal phalanx fragments.  Will refer at this point to a hand surgeon for further recommendations which may include further splinting, OT, operative intervention.  Continue current work restrictions in the meantime.

## 2011-12-16 ENCOUNTER — Other Ambulatory Visit: Payer: Self-pay | Admitting: Family

## 2012-02-13 ENCOUNTER — Ambulatory Visit: Payer: Managed Care, Other (non HMO) | Admitting: Family

## 2012-02-27 ENCOUNTER — Ambulatory Visit: Payer: Managed Care, Other (non HMO) | Admitting: Family

## 2012-02-27 DIAGNOSIS — Z0289 Encounter for other administrative examinations: Secondary | ICD-10-CM

## 2012-03-16 ENCOUNTER — Encounter: Payer: Self-pay | Admitting: Family

## 2012-03-16 DIAGNOSIS — E559 Vitamin D deficiency, unspecified: Secondary | ICD-10-CM | POA: Insufficient documentation

## 2012-03-16 HISTORY — DX: Vitamin D deficiency, unspecified: E55.9

## 2012-03-19 ENCOUNTER — Telehealth: Payer: Self-pay | Admitting: Family

## 2012-03-19 NOTE — Telephone Encounter (Signed)
Received medical records from [facility] ° °P: [phone] °F: [phone] °

## 2012-03-22 IMAGING — CR DG HAND 2V*R*
2 series · 2 of 2 positions shown · non-contrast
Comparison: 10/23/2011

CLINICAL DATA: Fall, hand pain involving the second and third
digits

RIGHT HAND - 2 VIEW

[x hand pa right]
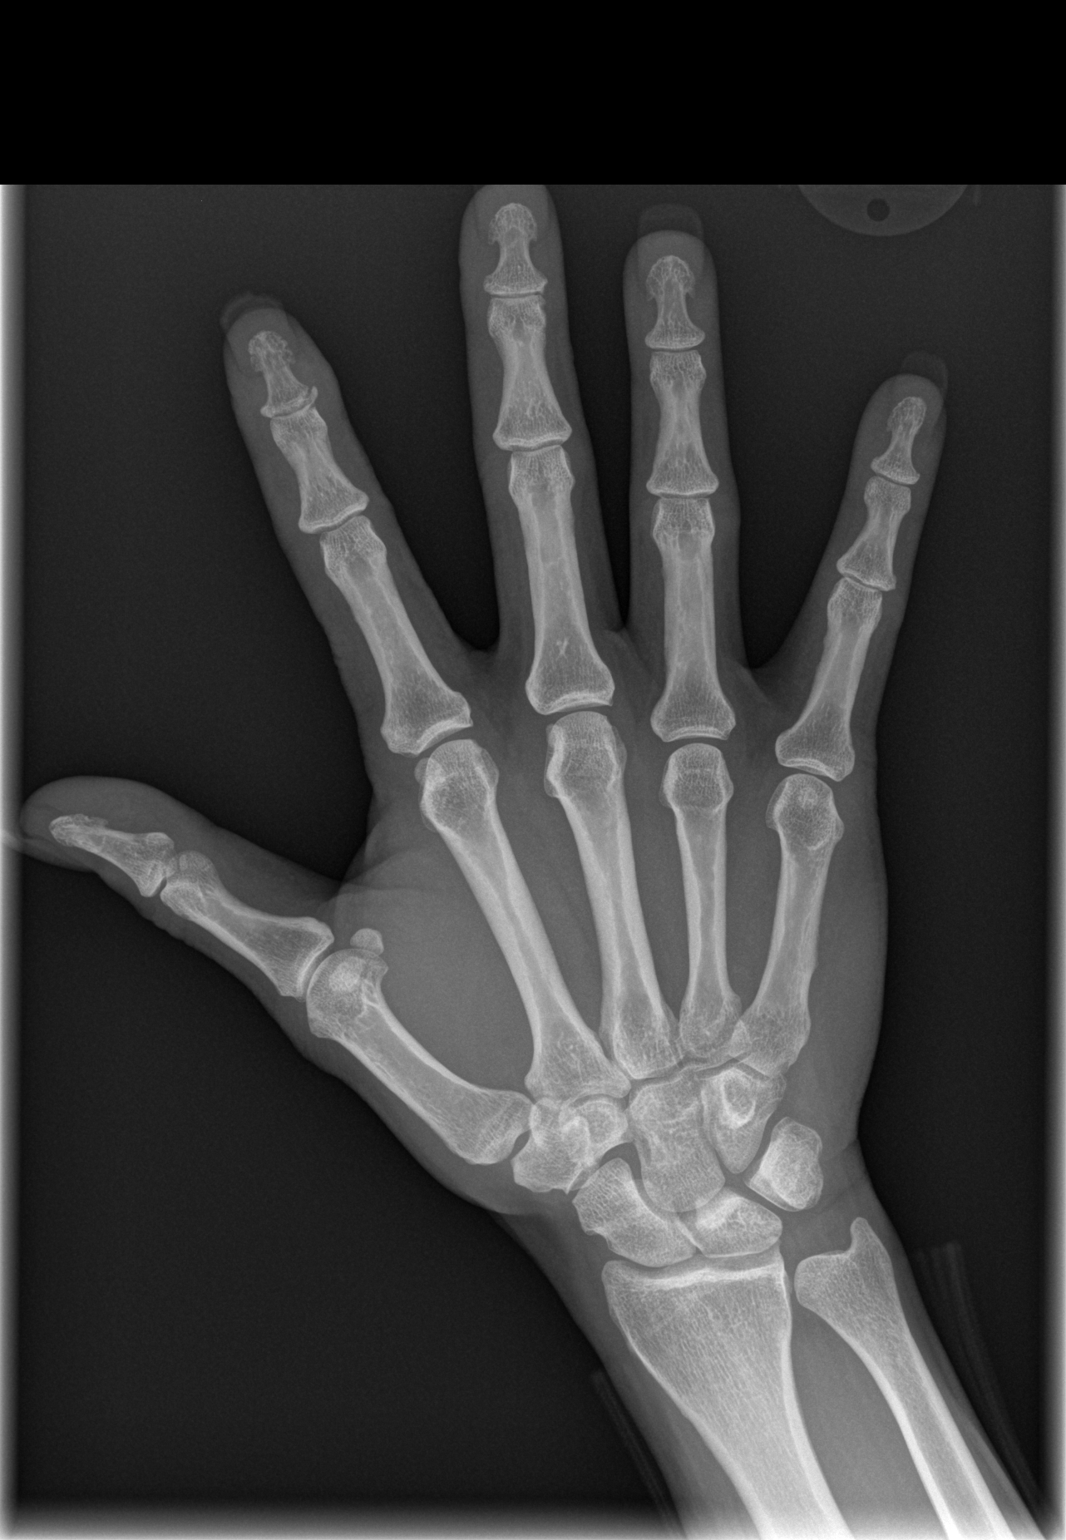

[x hand lat right]
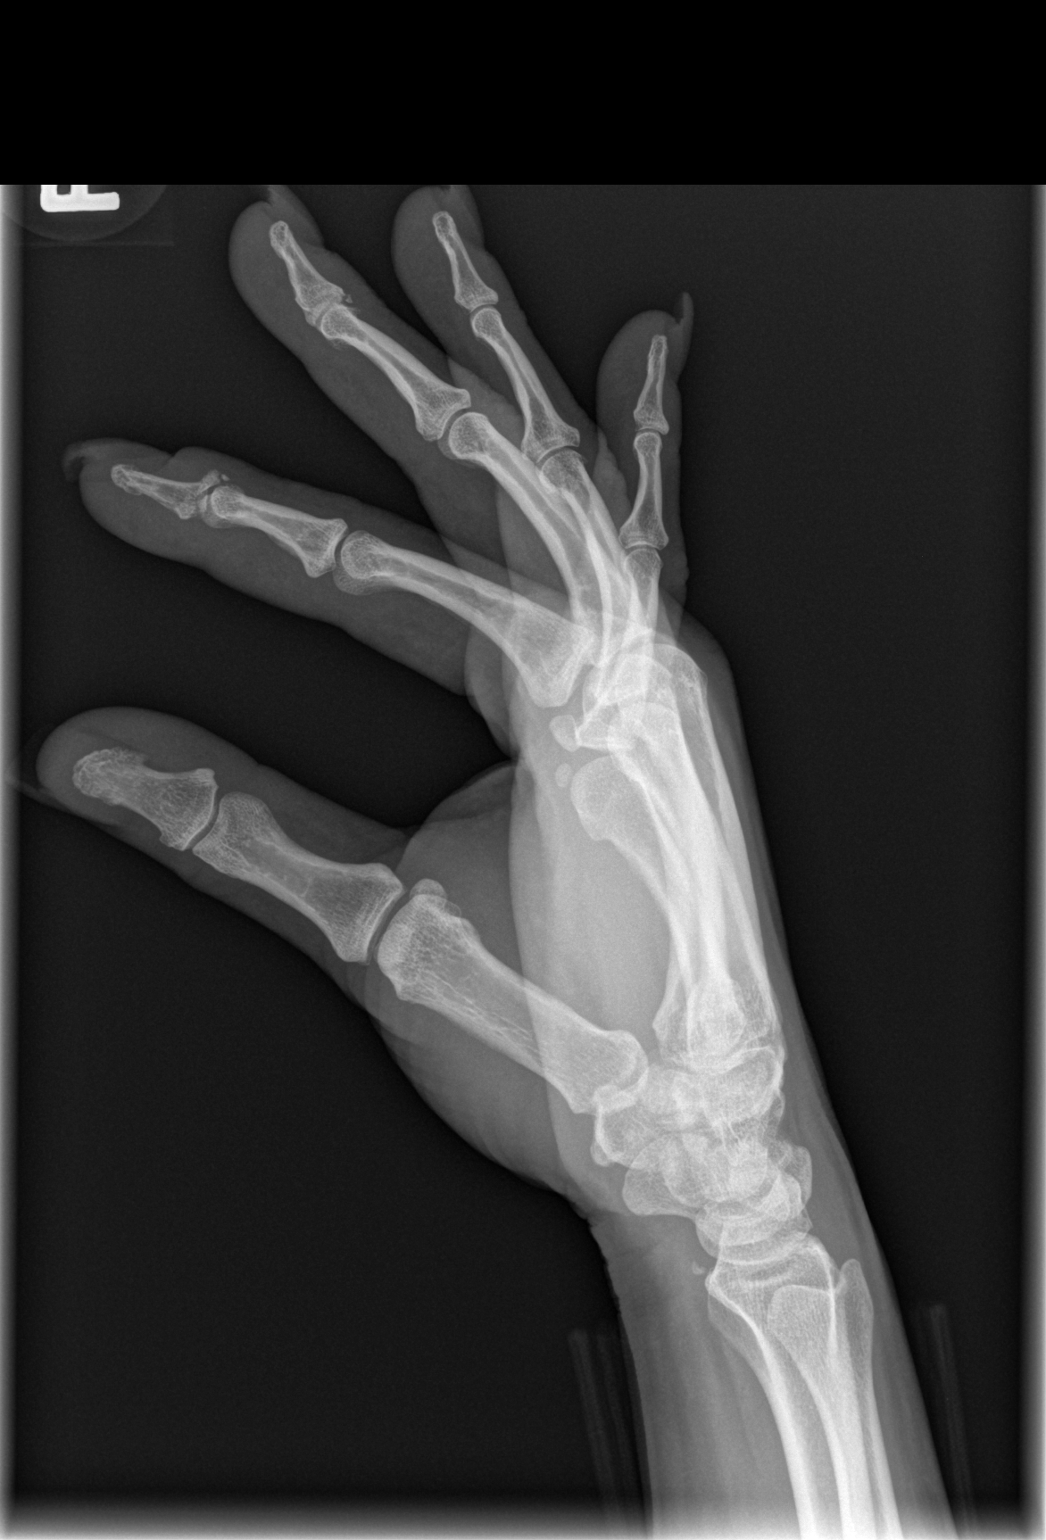

[2 of 2 positions shown; findings below may reference images not displayed]

FINDINGS: Tiny ossified fragments along the dorsal surface of the
second and third DIP joints as before.  No significant interval
change.  No subluxation or dislocation.  No other acute osseous
abnormality.
IMPRESSION: Stable tiny ossified fragments along the dorsal aspect of the
second and third DIP joints similar to the prior studies.  No new
osseous finding.

## 2012-04-05 IMAGING — CR DG HAND 2V*R*
2 series · 2 of 2 positions shown · non-contrast
Comparison: 11/06/11

CLINICAL DATA: Follow up right hand injury

RIGHT HAND - 2 VIEW

[x hand pa right]
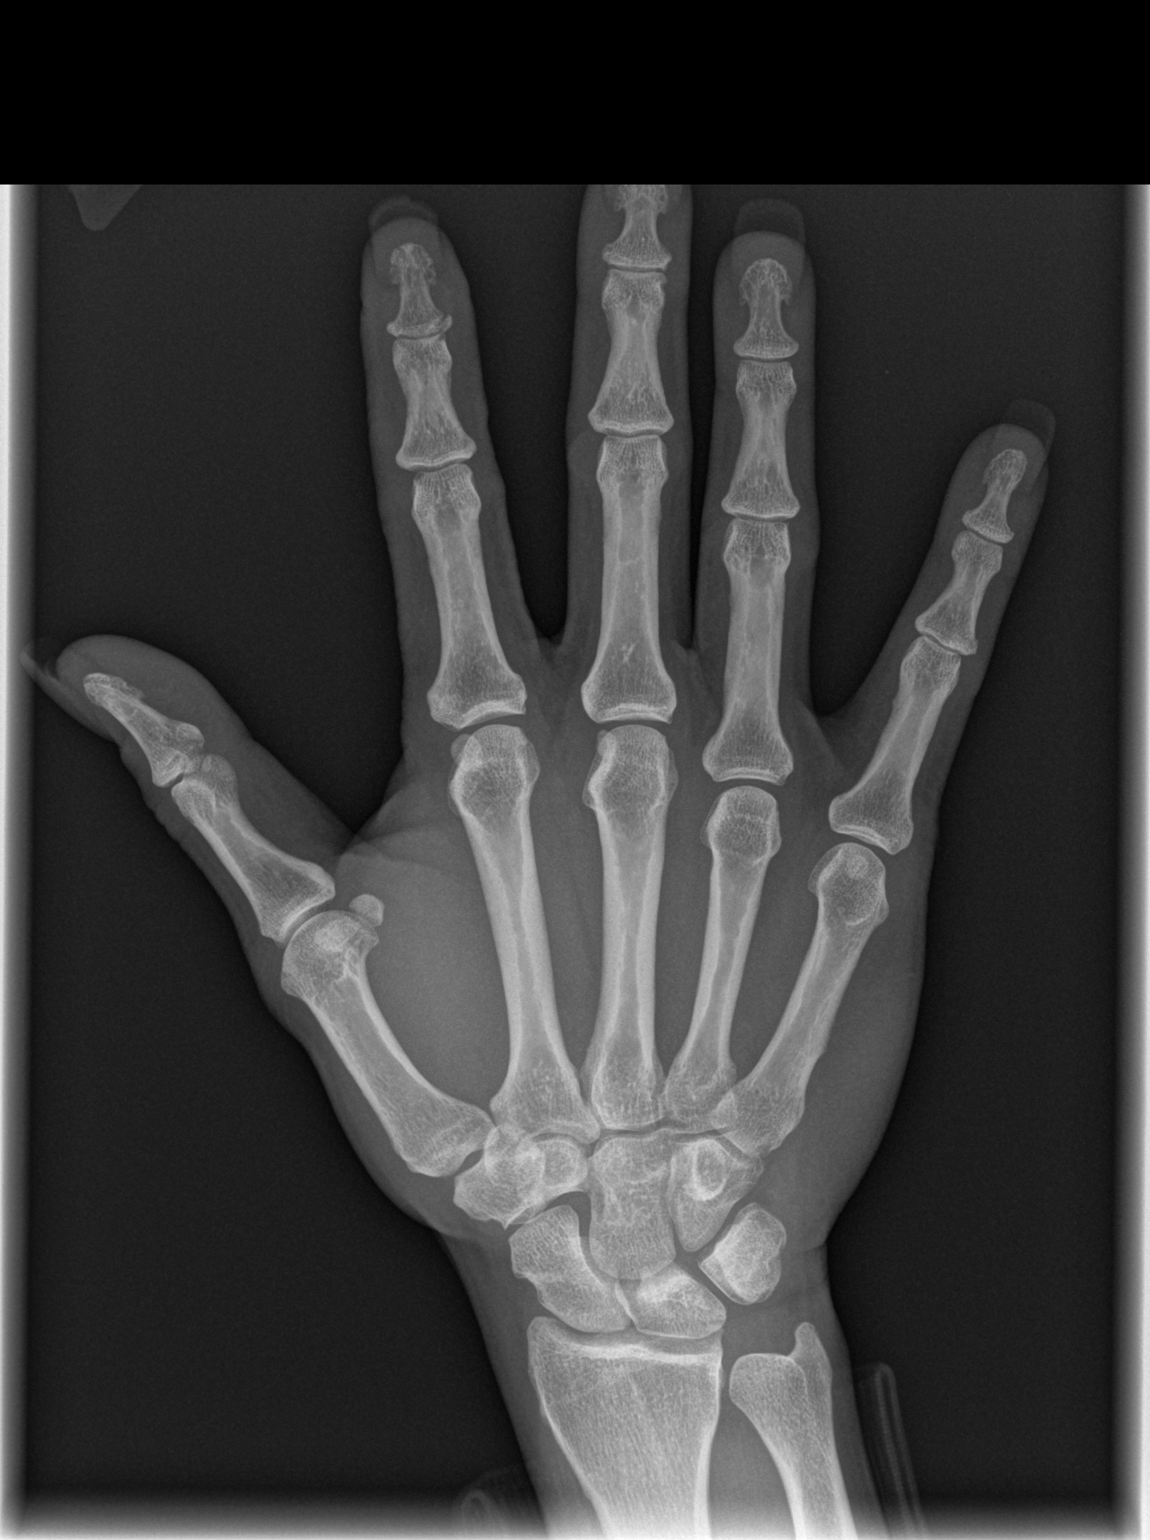

[x hand lat right]
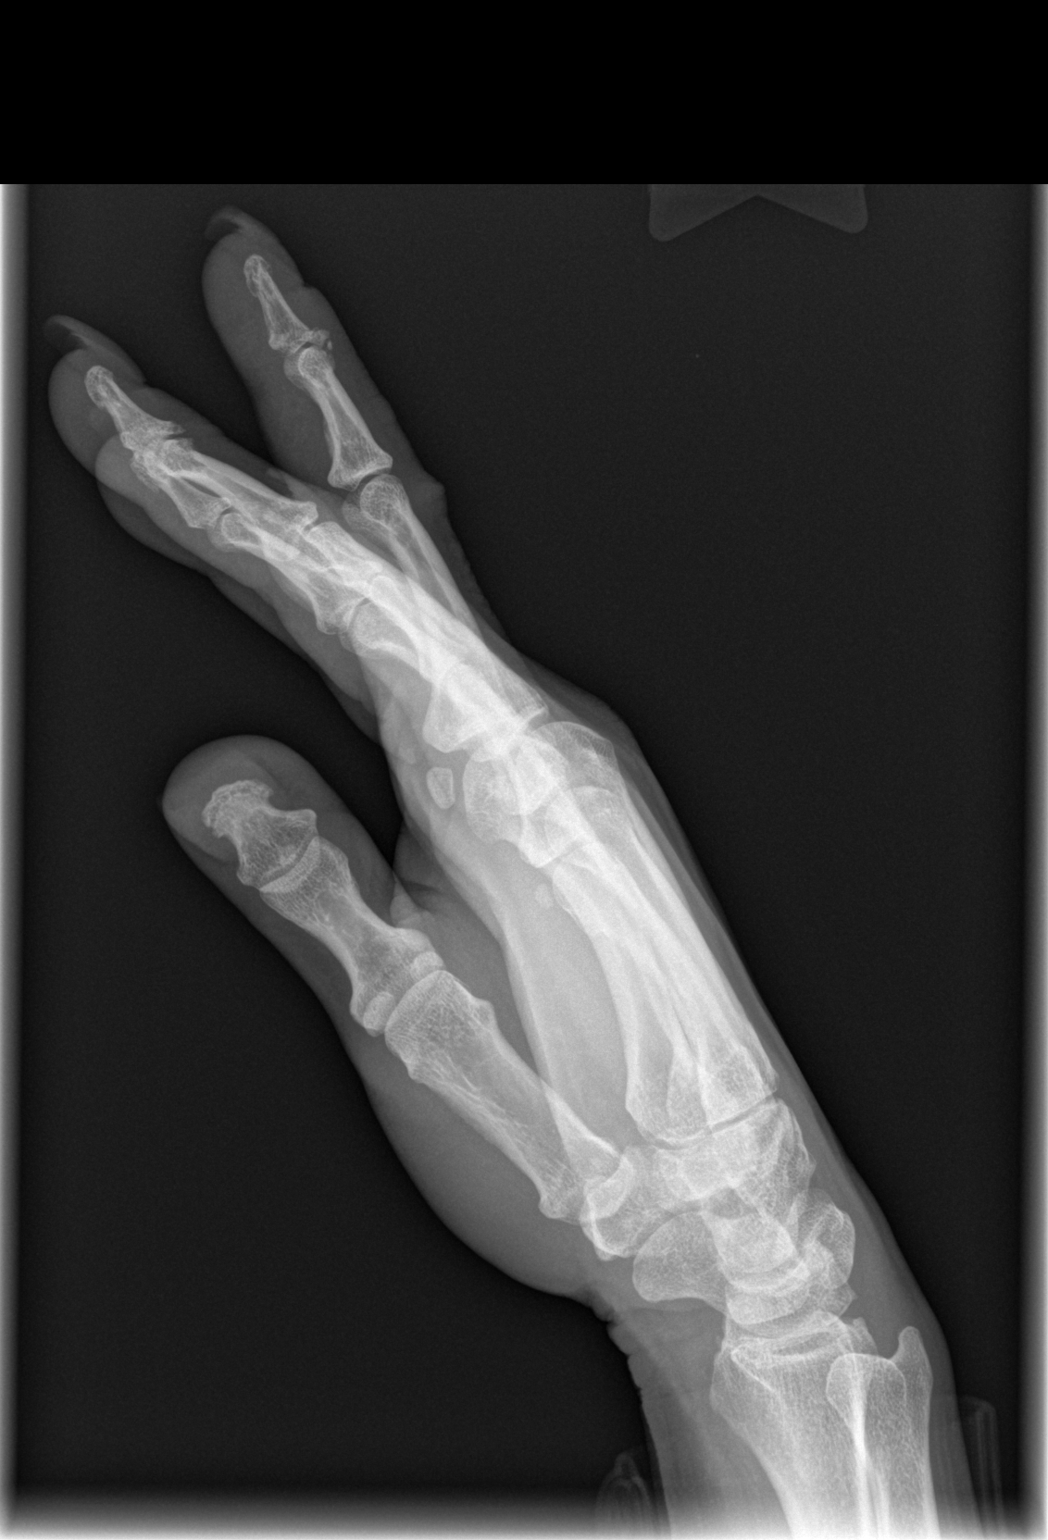

[2 of 2 positions shown; findings below may reference images not displayed]

FINDINGS: Two views of the right hand submitted.  No acute fracture
or subluxation.  Stable well corticated bony fragments along the
dorsal aspect second and third distal interphalangeal joints
without change from prior exam.
IMPRESSION: No acute fracture or subluxation.  Stable well corticated bony
fragments along the dorsal aspect second and third distal
interphalangeal joints without change from prior exam.

## 2012-04-27 ENCOUNTER — Other Ambulatory Visit: Payer: Self-pay | Admitting: Family

## 2012-04-28 NOTE — Telephone Encounter (Signed)
Pt called requesting status of refill request. Advised pt that refill was sent to pharmacy this morning and that she is due for office visit. Pt scheduled f/u for 05/21/12 at 8:30am.

## 2012-04-28 NOTE — Telephone Encounter (Signed)
Rx done/SLS *PATIENT DUE FOR OFFICE VISIT-REQUIRED FOR FUTURE REFILLS*

## 2012-05-21 ENCOUNTER — Ambulatory Visit (INDEPENDENT_AMBULATORY_CARE_PROVIDER_SITE_OTHER): Payer: Managed Care, Other (non HMO) | Admitting: Family

## 2012-05-21 ENCOUNTER — Encounter: Payer: Self-pay | Admitting: Family

## 2012-05-21 VITALS — BP 110/82 | HR 81 | Temp 99.0°F | Resp 16 | Wt 154.0 lb

## 2012-05-21 DIAGNOSIS — I10 Essential (primary) hypertension: Secondary | ICD-10-CM

## 2012-05-21 LAB — BASIC METABOLIC PANEL
BUN: 24 mg/dL — ABNORMAL HIGH (ref 6–23)
CO2: 25 mEq/L (ref 19–32)
Calcium: 9.9 mg/dL (ref 8.4–10.5)
Chloride: 103 mEq/L (ref 96–112)
Creat: 1.05 mg/dL (ref 0.50–1.10)

## 2012-05-21 MED ORDER — OLMESARTAN-AMLODIPINE-HCTZ 20-5-12.5 MG PO TABS: ORAL_TABLET | ORAL | Status: DC

## 2012-05-21 NOTE — Patient Instructions (Addendum)
Please complete your blood work prior to leaving. Schedule fasting physical at the front desk.

## 2012-05-21 NOTE — Assessment & Plan Note (Signed)
Stable on tribenzor, continue same, obtain bmet.

## 2012-05-21 NOTE — Progress Notes (Signed)
  Subjective:    Patient ID: Tiffany May, female    DOB: 06/11/1961, 51 y.o.   MRN: [id number]  HPI  Patient presents today for followup of hypertension.  Patient has been treated for Chronic HTN for quiet sometime. She is currently on tribenzor, and well controlled. No associated S/S related to HTN.   Quality: chronic Modifying factor: meds Duration: Quite sometime Associated S/S: None.  The patient denies the following associated symptoms: Chest pain, dyspnea, blurred vision, headache, or lower extremity edema.     Review of Systems See HPI  Past Medical History  Diagnosis Date  . Hypertension   . History of chicken pox   . Glaucoma   . Vitamin d deficiency 03/16/2012    History   Social History  . Marital Status: Single    Spouse Name: N/A    Number of Children: 1  . Years of Education: N/A   Occupational History  .  [employer]   Social History Main Topics  . Smoking status: Former Smoker    Quit date: 09/23/2011  . Smokeless tobacco: Not on file   Comment: 5 cigarettes daily; trying to quit  . Alcohol Use: 0.6 oz/week    1 Cans of beer per week     per day  . Drug Use: Yes    Special: Marijuana  . Sexually Active: Yes    Birth Control/ Protection: Surgical   Other Topics Concern  . Not on file   Social History Narrative   Caffeine Use:  1 soda dailyRegular exercise:  3 x weekly, walks dogs every day.Uses marijuana on weekends.She quit smoking cigarettes yesterday.  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 Abdominal hysterectomy 2008  . Cesarean section     Family History  Problem Relation Age of Onset  . Heart disease Mother     pacemaker  . Arthritis Maternal Grandmother   . Hypertension Maternal Grandmother   . Cancer Cousin     breast  . Cancer Cousin     colon  . Cancer Cousin     colon  . Cancer Cousin     colon    No Known Allergies  Current Outpatient  Prescriptions on File Prior to Visit  Medication Sig Dispense Refill  . DISCONTD: TRIBENZOR 20-5-12.5 MG TABS TAKE 1 TABLET BY MOUTH ONCE DAILY  30 tablet  1  . HYDROcodone-acetaminophen (VICODIN) 5-500 MG per tablet Take 1 tablet by mouth daily as needed.      Marland Kitchen DISCONTD: olmesartan-hydrochlorothiazide (BENICAR HCT) 20-12.5 MG per tablet Take 1 tablet by mouth daily.  21 tablet  0    BP 110/82  Pulse 81  Temp 99 F (37.2 C) (Oral)  Resp 16  Wt 154 lb (69.854 kg)  SpO2 100%       Objective:   Physical Exam  Constitutional: She appears well-developed and well-nourished. No distress.  Cardiovascular: Normal rate and regular rhythm.   No murmur heard. Pulmonary/Chest: Effort normal and breath sounds normal. No respiratory distress. She has no wheezes. She has no rales. She exhibits no tenderness.  Musculoskeletal: She exhibits no edema.  Psychiatric: She has a normal mood and affect. Her behavior is normal. Judgment and thought content normal.          Assessment & Plan:  Declines flu shot

## 2012-05-24 ENCOUNTER — Encounter: Payer: Self-pay | Admitting: Family

## 2012-06-18 ENCOUNTER — Ambulatory Visit (HOSPITAL_BASED_OUTPATIENT_CLINIC_OR_DEPARTMENT_OTHER)
Admission: RE | Admit: 2012-06-18 | Discharge: 2012-06-18 | Disposition: A | Discharge status: Home / Self Care | Payer: Managed Care, Other (non HMO) | Source: Ambulatory Visit | Attending: Family | Admitting: Family

## 2012-06-18 ENCOUNTER — Encounter: Payer: Self-pay | Admitting: Family

## 2012-06-18 ENCOUNTER — Other Ambulatory Visit: Payer: Self-pay | Admitting: Family

## 2012-06-18 ENCOUNTER — Ambulatory Visit (INDEPENDENT_AMBULATORY_CARE_PROVIDER_SITE_OTHER): Payer: Managed Care, Other (non HMO) | Admitting: Family

## 2012-06-18 VITALS — BP 128/84 | HR 69 | Temp 98.4°F | Resp 16 | Ht 65.0 in | Wt 158.1 lb

## 2012-06-18 DIAGNOSIS — Z1231 Encounter for screening mammogram for malignant neoplasm of breast: Secondary | ICD-10-CM

## 2012-06-18 DIAGNOSIS — M25511 Pain in right shoulder: Secondary | ICD-10-CM

## 2012-06-18 DIAGNOSIS — Z0000 Encounter for general adult medical examination without abnormal findings: Secondary | ICD-10-CM | POA: Insufficient documentation

## 2012-06-18 DIAGNOSIS — M25519 Pain in unspecified shoulder: Secondary | ICD-10-CM

## 2012-06-18 DIAGNOSIS — Z136 Encounter for screening for cardiovascular disorders: Secondary | ICD-10-CM

## 2012-06-18 DIAGNOSIS — Z23 Encounter for immunization: Secondary | ICD-10-CM

## 2012-06-18 LAB — LIPID PANEL
Cholesterol: 241 mg/dL — ABNORMAL HIGH (ref 0–200)
HDL: 53 mg/dL (ref >39)
Total CHOL/HDL Ratio: 4.5 Ratio
Triglycerides: 102 mg/dL (ref <150)
VLDL: 20 mg/dL (ref 0–40)

## 2012-06-18 LAB — HEPATIC FUNCTION PANEL
Albumin: 4.5 g/dL (ref 3.5–5.2)
Bilirubin, Direct: 0.1 mg/dL (ref 0.0–0.3)
Total Bilirubin: 0.4 mg/dL (ref 0.3–1.2)

## 2012-06-18 LAB — CBC WITH DIFFERENTIAL/PLATELET
Basophils Relative: 0 % (ref 0–1)
Eosinophils Absolute: 0.1 10*3/uL (ref 0.0–0.7)
Hemoglobin: 12.4 g/dL (ref 12.0–15.0)
MCH: 31.2 pg (ref 26.0–34.0)
MCHC: 33.2 g/dL (ref 30.0–36.0)
Neutro Abs: 2.6 10*3/uL (ref 1.7–7.7)
Neutrophils Relative %: 49 % (ref 43–77)
Platelets: 306 10*3/uL (ref 150–400)
RBC: 3.97 MIL/uL (ref 3.87–5.11)

## 2012-06-18 NOTE — Progress Notes (Signed)
Subjective:    Patient ID: Tiffany May, female    DOB: March 12, 1961, 51 y.o.   MRN: [id number]  HPI  Patient presents today for complete physical.  Immunizations: Declines flu shot, ? Last tetanus Diet: She reports healthy diet, but "big appetite." Exercise: She reports that she walks her dogs 2-3 miles every Saturday/sunday Colonoscopy: will be due next year.  Dexa:1/12 last dexa- performed at [facility] Pap Smear: >2 years.   Ovaries intact. Reports some hot flashes Mammogram:Due    Review of Systems  Constitutional: Negative for unexpected weight change.  HENT: Negative for hearing loss and congestion.   Eyes: Negative for visual disturbance.  Respiratory: Negative for cough.   Cardiovascular: Negative for chest pain.  Gastrointestinal: Negative for nausea, vomiting and diarrhea.  Genitourinary: Positive for urgency. Negative for dysuria and frequency.  Musculoskeletal: Positive for arthralgias.       R shoulder pain for "months."  Chronic pain- has been using advil prn  Skin: Negative for rash.  Neurological: Negative for headaches.  Hematological: Negative for adenopathy.  Psychiatric/Behavioral:       Denies depression/anxiety   Past Medical History  Diagnosis Date  . Hypertension   . History of chicken pox   . Glaucoma(365)   . Vitamin D deficiency 03/16/2012    History   Social History  . Marital Status: Single    Spouse Name: N/A    Number of Children: 1  . Years of Education: N/A   Occupational History  .  [employer]   Social History Main Topics  . Smoking status: Current Every Day Smoker  . Smokeless tobacco: Not on file   Comment: 5 cigarettes daily; trying to quit  . Alcohol Use: 0.6 oz/week    1 Cans of beer per week     per day  . Drug Use: Yes    Special: Marijuana  . Sexually Active: Yes    Birth Control/ Protection: Surgical   Other Topics Concern  . Not on file   Social History Narrative   Caffeine Use:  1 soda dailyRegular  exercise:  3 x weekly, walks dogs every day.Uses marijuana on weekends.She quit smoking cigarettes yesterday.  [employer]- customer serviceComplete collegeLives with son age 8, only child    Past Surgical History  Procedure Date  . Abdominal hysterectomy 2008  . Cesarean section     Family History  Problem Relation Age of Onset  . Heart disease Mother     pacemaker  . Arthritis Maternal Grandmother   . Hypertension Maternal Grandmother   . Cancer Cousin     breast  . Cancer Cousin     colon  . Cancer Cousin     colon  . Cancer Cousin     colon    No Known Allergies  Current Outpatient Prescriptions on File Prior to Visit  Medication Sig Dispense Refill  . HYDROcodone-acetaminophen (VICODIN) 5-500 MG per tablet Take 1 tablet by mouth daily as needed.      . Olmesartan-Amlodipine-HCTZ (TRIBENZOR) 20-5-12.5 MG TABS One tablet by mouth daily  30 tablet  5  . DISCONTD: olmesartan-hydrochlorothiazide (BENICAR HCT) 20-12.5 MG per tablet Take 1 tablet by mouth daily.  21 tablet  0    BP 128/84  Pulse 69  Temp 98.4 F (36.9 C) (Oral)  Resp 16  Ht 5\' 5"  (1.651 m)  Wt 158 lb 1.3 oz (71.705 kg)  BMI 26.31 kg/m2  SpO2 99%        Objective:   Physical  Exam  Physical Exam  Constitutional: She is oriented to person, place, and time. She appears well-developed and well-nourished. No distress.  HENT:  Head: Normocephalic and atraumatic.  Right Ear: Tympanic membrane and ear canal normal.  Left Ear: Tympanic membrane and ear canal normal.  Mouth/Throat: Oropharynx is clear and moist.  Eyes: Pupils are equal, round, and reactive to light. No scleral icterus.  Neck: Normal range of motion. No thyromegaly present.  Cardiovascular: Normal rate and regular rhythm.   No murmur heard. Pulmonary/Chest: Effort normal and breath sounds normal. No respiratory distress. He has no wheezes. She has no rales. She exhibits no tenderness.  Abdominal: Soft. Bowel sounds are normal. He exhibits  no distension and no mass. There is no tenderness. There is no rebound and no guarding.  Musculoskeletal: She exhibits no edema. No tenderness to palpation or swelling of the right shoulder.  Difficulty raising the right arm laterally Lymphadenopathy:    She has no cervical adenopathy.  Neurological: She is alert and oriented to person, place, and time. She has normal reflexes. She exhibits normal muscle tone. Coordination normal.  Skin: Skin is warm and dry.  Psychiatric: She has a normal mood and affect. Her behavior is normal. Judgment and thought content normal.  Breasts: Examined lying Right: Without masses, retractions, discharge or axillary adenopathy.  Left: Without masses, retractions, discharge or axillary adenopathy.  Inguinal/mons: Normal without inguinal adenopathy  External genitalia: Normal  BUS/Urethra/Skene's glands: Normal  Bladder: Normal  Vagina: Normal  Cervix:surgically absent Uterus: surgically absent Adnexa/parametria:  Rt: Without masses or tenderness.  Lt: Without masses or tenderness.  Anus and perineum: Normal           Assessment & Plan:         Assessment & Plan:  Will refer to Sports medicine for evaluation of right shoulder pain.

## 2012-06-18 NOTE — Patient Instructions (Addendum)
Complete your blood work on prior to leaving. Schedule mammogram on the first floor. Please schedule a follow up appointment in 3 months.

## 2012-06-18 NOTE — Assessment & Plan Note (Signed)
Pt counseled on diet, exercise weight loss.  Obtain fasting lab work.  Order mammogram. Start colo next year.

## 2012-06-19 LAB — URINALYSIS, ROUTINE W REFLEX MICROSCOPIC
Bilirubin Urine: NEGATIVE
Glucose, UA: NEGATIVE mg/dL
Hgb urine dipstick: NEGATIVE
Ketones, ur: NEGATIVE mg/dL
Leukocytes, UA: NEGATIVE
pH: 5.5 (ref 5.0–8.0)

## 2012-06-21 ENCOUNTER — Ambulatory Visit: Payer: Managed Care, Other (non HMO) | Admitting: Family Medicine

## 2012-06-22 ENCOUNTER — Telehealth: Payer: Self-pay | Admitting: Family

## 2012-06-22 ENCOUNTER — Encounter: Payer: Self-pay | Admitting: Family

## 2012-06-22 DIAGNOSIS — E785 Hyperlipidemia, unspecified: Secondary | ICD-10-CM

## 2012-06-22 HISTORY — DX: Hyperlipidemia, unspecified: E78.5

## 2012-06-22 NOTE — Telephone Encounter (Signed)
Left message on cell# to return my call. 

## 2012-06-22 NOTE — Telephone Encounter (Signed)
Please call pt and let her know that her cholesterol is elevated.  I recommend that she work hard on a low fat/low cholesterol diet and regular exercise.  She should repeat flp  in 6 months dx hyperlipidemia.  Kidneys, thyroid, electrolytes, blood count, liver and urine all look good.

## 2012-06-24 NOTE — Telephone Encounter (Signed)
Left message on cell# to return my call. 

## 2012-06-25 NOTE — Telephone Encounter (Signed)
Left message for pt to return my call and mailed detailed letter and lab reminder re: info below. Copy given to the lab.

## 2012-09-17 ENCOUNTER — Ambulatory Visit: Payer: Managed Care, Other (non HMO) | Admitting: Family

## 2012-09-17 DIAGNOSIS — Z0289 Encounter for other administrative examinations: Secondary | ICD-10-CM

## 2013-01-25 ENCOUNTER — Other Ambulatory Visit: Payer: Self-pay | Admitting: Family

## 2013-01-25 NOTE — Telephone Encounter (Signed)
Med filled. Pt will be due for a follow up.

## 2013-04-08 ENCOUNTER — Other Ambulatory Visit: Payer: Self-pay | Admitting: Family

## 2013-04-08 NOTE — Telephone Encounter (Signed)
30 day supply of tribenzor sent to pharmacy. Pt was last seen 06/18/12 and is past due for follow up. Please call pt to arrange within the next 30 days.

## 2013-04-13 NOTE — Telephone Encounter (Signed)
Left message for patient to return my call.

## 2013-04-14 NOTE — Telephone Encounter (Signed)
Left message for patient to return my call.

## 2013-04-15 NOTE — Telephone Encounter (Signed)
Left message for patient to return my call.

## 2013-04-15 NOTE — Telephone Encounter (Signed)
Mailed letter to pt to call and arrange follow up before further refills are given.

## 2013-05-02 ENCOUNTER — Ambulatory Visit: Payer: Managed Care, Other (non HMO) | Admitting: Family

## 2013-05-04 ENCOUNTER — Encounter: Payer: Self-pay | Admitting: Family

## 2013-05-04 ENCOUNTER — Ambulatory Visit (INDEPENDENT_AMBULATORY_CARE_PROVIDER_SITE_OTHER): Payer: Managed Care, Other (non HMO) | Admitting: Family

## 2013-05-04 VITALS — BP 122/82 | HR 81 | Temp 98.2°F | Resp 16 | Wt 157.0 lb

## 2013-05-04 DIAGNOSIS — F418 Other specified anxiety disorders: Secondary | ICD-10-CM

## 2013-05-04 DIAGNOSIS — F341 Dysthymic disorder: Secondary | ICD-10-CM

## 2013-05-04 MED ORDER — CITALOPRAM HYDROBROMIDE 10 MG PO TABS: ORAL_TABLET | ORAL | Status: DC

## 2013-05-04 NOTE — Assessment & Plan Note (Signed)
Will plan trial of citalopram 10mg .  I instructed pt to start 1/2 tablet once daily for 1 week and then increase to a full tablet once daily on week two as tolerated.  We discussed common side effects such as nausea, drowsiness and weight gain.  Also discussed rare but serious side effect of suicide ideation.  She is instructed to discontinue medication go directly to ED if this occurs.  Pt verbalizes understanding. Continue with therapist.  I agree that she should remain out of work. She is currently written to return 10/1.  I will have her follow up with [place] prior to her scheduled return date to see how she is doing.

## 2013-05-04 NOTE — Progress Notes (Signed)
Subjective:    Patient ID: Tiffany May, female    DOB: [birth year], 52 y.o.   MRN: [id number]  HPI  Tiffany May is a 52 yr old female who presents today to discuss depression.  She describes increased stress recently.  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  Reports episode of an escalated call the other day.  The member requested a supervisor.  Per protocol, she asked her supervisor to take the call. She reports Supervisor refused the call and "kept telling me to do the things I had already done on the phone."  She then broke down and began yelling at supervisor/swearing saying she could not take it anymore.  This episode occurred 2 weeks ago.  It was recommended by a higher level supervisor that she be evaluated by [facility] and take some time off. She has turned her FMLA in to the [employer] therapist Tiffany May.  Ms. Tiffany May advised the pt to be seen by her provider for medication.    Reports that she has been crying a lot lately.  Stopped seeing her friends, doesn't want to leave the house. Feels sad all the time.  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 She is an only child. Mother is on a feeding tube.  Finds herself feeling angry and resenting her mom then feeling bad because she loves her mom.  Reports that she has been eating more than usual, not sleeping well and impatient with her son. She also reports hot flashes that are worse at night.  She also reports recent hx of panic attacks. Denies SI/HI.    Review of Systems Past Medical History  Diagnosis Date  . Hypertension   . History of chicken pox   . Glaucoma   . Vitamin D deficiency 03/16/2012  . Hyperlipidemia 06/22/2012    History   Social History  . Marital Status: Single    Spouse Name: N/A    Number of Children: 1  . Years of Education: N/A   Occupational History  .  [employer]   Social History Main Topics  .  Smoking status: Current Every Day Smoker -- 2.50 packs/day    Types: Cigarettes  . Smokeless tobacco: Not on file  . Alcohol Use: 0.6 oz/week    1 Cans of beer per week     Comment: per day  . Drug Use: Yes    Special: Marijuana  . Sexual Activity: Yes    Birth Control/ Protection: Surgical   Other Topics Concern  . Not on file   Social History Narrative   Caffeine Use:  1 soda daily   Regular exercise:  3 x weekly, walks dogs every day.   Uses marijuana on weekends.   She quit smoking cigarettes yesterday.     Community education officer- customer service   Complete college   Lives with son age 31, only child             Past Surgical History  Procedure Laterality Date  . Abdominal hysterectomy  2008  . Cesarean section      Family History  Problem Relation Age of Onset  . Heart disease Mother     pacemaker  . Arthritis Maternal Grandmother   . Hypertension Maternal Grandmother   . Cancer Cousin     breast  . Cancer Cousin     colon  .  Cancer Cousin     colon  . Cancer Cousin     colon    No Known Allergies  Current Outpatient Prescriptions on File Prior to Visit  Medication Sig Dispense Refill  . HYDROcodone-acetaminophen (VICODIN) 5-500 MG per tablet Take 1 tablet by mouth daily as needed.      Marya Landry 20-5-12.5 MG TABS TAKE 1 TABLET BY MOUTH ONCE DAILY  30 tablet  0  . [DISCONTINUED] olmesartan-hydrochlorothiazide (BENICAR HCT) 20-12.5 MG per tablet Take 1 tablet by mouth daily.  21 tablet  0   No current facility-administered medications on file prior to visit.    BP 122/82  Pulse 81  Temp(Src) 98.2 F (36.8 C) (Oral)  Resp 16  Wt 157 lb (71.215 kg)  BMI 26.13 kg/m2  SpO2 99%       Objective:   Physical Exam  Constitutional: She is oriented to person, place, and time. She appears well-developed and well-nourished. No distress.  Pulmonary/Chest: Breath sounds normal.  Neurological: She is alert and oriented to person, place, and time.  Psychiatric: Her  speech is normal. Judgment and thought content normal. Cognition and memory are normal.  Pt is tearful and upset at times during the interview.           Assessment & Plan:  35 minutes spent with pt today. >50% of this time was spent counseling pt on her anxiety and depression. Pt contracts for safety.

## 2013-05-04 NOTE — Patient Instructions (Addendum)
Please follow up on 9/29. Start citalopram 10 mg- take 1/2 tablet once daily for one week, then increase to a full tablet on week two.

## 2013-05-11 ENCOUNTER — Other Ambulatory Visit: Payer: Self-pay | Admitting: Family

## 2013-05-23 ENCOUNTER — Encounter: Payer: Self-pay | Admitting: Family

## 2013-05-23 ENCOUNTER — Ambulatory Visit (INDEPENDENT_AMBULATORY_CARE_PROVIDER_SITE_OTHER): Payer: Managed Care, Other (non HMO) | Admitting: Family

## 2013-05-23 VITALS — BP 166/100 | HR 66 | Temp 98.0°F | Resp 16 | Ht 65.0 in | Wt 156.1 lb

## 2013-05-23 DIAGNOSIS — I10 Essential (primary) hypertension: Secondary | ICD-10-CM

## 2013-05-23 DIAGNOSIS — F418 Other specified anxiety disorders: Secondary | ICD-10-CM

## 2013-05-23 DIAGNOSIS — F341 Dysthymic disorder: Secondary | ICD-10-CM

## 2013-05-23 MED ORDER — BUPROPION HCL 75 MG PO TABS: ORAL_TABLET | ORAL | Status: DC

## 2013-05-23 NOTE — Patient Instructions (Addendum)
Start wellbutrin. Follow up in 1 month.

## 2013-05-23 NOTE — Assessment & Plan Note (Signed)
Deteriorate. Resume tribenzor. Will repeat next visit at her 1 month follow up.

## 2013-05-23 NOTE — Assessment & Plan Note (Addendum)
Unchanged.  I agree that she does not seem ready to return to work and I have advised her to speak with her counselor about extending her FMLA leave.  In the meantime, will attempt wellbutrin.  She is a smoker and we discussed that this might help her to quit smoking as well. Plan follow up in 1 month.  15 minutes spent with pt today.  >50% of this time was spent counseling pt on her anxiety and depression.

## 2013-05-23 NOTE — Progress Notes (Signed)
Subjective:    Patient ID: Tiffany May, female    DOB: 1961-04-18, 52 y.o.   MRN: [id number]  HPI  Tiffany May is a 52 yr old female who presents today for follow up of her depression. She is scheduled to go back to work on Wednesday.  She reports that citalopram made her sleepy and "didn't like the way I felt."  As a result she discontinued her citalopram.  Reports that she continues to feel down.  Wants to stay in the bed.  Continues to feel overwhelmed about caring for her multiple elderly family members.  Recently her aunt's health has deteriorated and she is feeling overwhelmed by this.  She continues to meet with her counselor and has a meeting later this afternoon. She is out of work on [employer] and is scheduled to return on Wednesday this week. Doesn't feel that she is is mentally ready to return.  Reports that she hasn't been taking her BP meds as she is supposed to.  Denies active suicid ideation.   Review of Systems    see HPI  Past Medical History  Diagnosis Date  . Hypertension   . History of chicken pox   . Glaucoma   . Vitamin D deficiency 03/16/2012  . Hyperlipidemia 06/22/2012    History   Social History  . Marital Status: Single    Spouse Name: N/A    Number of Children: 1  . Years of Education: N/A   Occupational History  .  [employer]   Social History Main Topics  . Smoking status: Current Every Day Smoker -- 2.50 packs/day    Types: Cigarettes  . Smokeless tobacco: Not on file  . Alcohol Use: 0.6 oz/week    1 Cans of beer per week     Comment: per day  . Drug Use: Yes    Special: Marijuana  . Sexual Activity: Yes    Birth Control/ Protection: Surgical   Other Topics Concern  . Not on file   Social History Narrative   Caffeine Use:  1 soda daily   Regular exercise:  3 x weekly, walks dogs every day.   Uses marijuana on weekends.   She quit smoking cigarettes yesterday.     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 Abdominal hysterectomy  2008  . Cesarean section      Family History  Problem Relation Age of Onset  . Heart disease Mother     pacemaker  . Arthritis Maternal Grandmother   . Hypertension Maternal Grandmother   . Cancer Cousin     breast  . Cancer Cousin     colon  . Cancer Cousin     colon  . Cancer Cousin     colon    No Known Allergies  Current Outpatient Prescriptions on File Prior to Visit  Medication Sig Dispense Refill  . TRIBENZOR 20-5-12.5 MG TABS TAKE 1 TABLET BY MOUTH ONCE DAILY  30 tablet  2  . [DISCONTINUED] olmesartan-hydrochlorothiazide (BENICAR HCT) 20-12.5 MG per tablet Take 1 tablet by mouth daily.  21 tablet  0   No current facility-administered medications on file prior to visit.    BP 166/100  Pulse 66  Temp(Src) 98 F (36.7 C) (Oral)  Resp 16  Ht 5\' 5"  (1.651 m)  Wt 156 lb 1.3  oz (70.797 kg)  BMI 25.97 kg/m2  SpO2 99%    Objective:   Physical Exam  Constitutional: She appears well-developed and well-nourished. No distress.  Psychiatric: Her speech is normal and behavior is normal. Judgment and thought content normal. Her affect is not angry, not blunt, not labile and not inappropriate. Cognition and memory are normal. She exhibits a depressed mood.          Assessment & Plan:

## 2013-06-03 ENCOUNTER — Encounter: Payer: Self-pay | Admitting: Family Medicine

## 2013-06-03 ENCOUNTER — Ambulatory Visit (INDEPENDENT_AMBULATORY_CARE_PROVIDER_SITE_OTHER): Payer: Managed Care, Other (non HMO) | Admitting: Family Medicine

## 2013-06-03 VITALS — BP 140/88 | HR 93 | Temp 98.1°F | Ht 65.0 in | Wt 156.0 lb

## 2013-06-03 DIAGNOSIS — I10 Essential (primary) hypertension: Secondary | ICD-10-CM

## 2013-06-03 DIAGNOSIS — IMO0001 Reserved for inherently not codable concepts without codable children: Secondary | ICD-10-CM

## 2013-06-03 DIAGNOSIS — M79609 Pain in unspecified limb: Secondary | ICD-10-CM

## 2013-06-03 DIAGNOSIS — S62609D Fracture of unspecified phalanx of unspecified finger, subsequent encounter for fracture with routine healing: Secondary | ICD-10-CM

## 2013-06-03 DIAGNOSIS — M79644 Pain in right finger(s): Secondary | ICD-10-CM

## 2013-06-03 MED ORDER — METHYLPREDNISOLONE (PAK) 4 MG PO TABS: ORAL_TABLET | ORAL | Status: DC

## 2013-06-03 MED ORDER — HYDROCODONE-ACETAMINOPHEN 5-325 MG PO TABS: 1.0000 | ORAL_TABLET | Freq: Four times a day (QID) | ORAL | Status: AC | PRN

## 2013-06-03 NOTE — Assessment & Plan Note (Signed)
Improved with recheck, no changes 

## 2013-06-03 NOTE — Assessment & Plan Note (Signed)
reinjured 2nd and 3rd fingers earlier this week by banging them on a hard surface, pain is not improving and she is having trouble sleeping. Question whether secondary gout has set in. Patient has to get back to work today but is given an order to have her Uric acid level checked on 10/13 if pain is still significant. Encouraged ice and Salon Pas tid, rest. Given some hydrocodone to use prn and a medrol dosepak for the the pain

## 2013-06-03 NOTE — Progress Notes (Signed)
Patient ID: Tiffany May, female   DOB: December 08, 1960, 52 y.o.   MRN: [id number] Tiffany May [id number] 1961/08/10 06/03/2013      Progress Note-Follow Up  Subjective  Chief Complaint  Chief Complaint  Patient presents with  . Hand Injury    reinjured right hand that she broke 1 yr ago- smashed fingers in door X 6 days ago    HPI  Patient is a 52 year old African American female who is in today complaining of increased pain in her right hand. She suffered a fracture of her fingers last year and had significant pain at that time. She was doing much better until this past week when she smashed her hand against a hard surface and had increased pain in her distal joints in her second and third fingers of her right hand again. Over the last several days the pain has not improved. He began to keep breath at night. Over-the-counter medication such as Aleve have not been helpful. She denies any other complaints. No chest pain, palpitations, shortness of breath  Past Medical History  Diagnosis Date  . Hypertension   . History of chicken pox   . Glaucoma   . Vitamin D deficiency 03/16/2012  . Hyperlipidemia 06/22/2012    Past Surgical History  Procedure Laterality Date  . Abdominal hysterectomy  2008  . Cesarean section      Family History  Problem Relation Age of Onset  . Heart disease Mother     pacemaker  . Arthritis Maternal Grandmother   . Hypertension Maternal Grandmother   . Cancer Cousin     breast  . Cancer Cousin     colon  . Cancer Cousin     colon  . Cancer Cousin     colon    History   Social History  . Marital Status: Single    Spouse Name: N/A    Number of Children: 1  . Years of Education: N/A   Occupational History  .  [employer]   Social History Main Topics  . Smoking status: Current Every Day Smoker -- 2.50 packs/day    Types: Cigarettes  . Smokeless tobacco: Not on file  . Alcohol Use: 0.6 oz/week    1 Cans of beer per week     Comment: per day   . Drug Use: Yes    Special: Marijuana  . Sexual Activity: Yes    Birth Control/ Protection: Surgical   Other Topics Concern  . Not on file   Social History Narrative   Caffeine Use:  1 soda daily   Regular exercise:  3 x weekly, walks dogs every day.   Uses marijuana on weekends.   She quit smoking cigarettes yesterday.     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 buPROPion (WELLBUTRIN) 75 MG tablet One tablet by mouth daily for 3 days, then increase to twice daily  60 tablet  0  . TRIBENZOR 20-5-12.5 MG TABS TAKE 1 TABLET BY MOUTH ONCE DAILY  30 tablet  2  . [DISCONTINUED] olmesartan-hydrochlorothiazide (BENICAR HCT) 20-12.5 MG per tablet Take 1 tablet by mouth daily.  21 tablet  0   No current facility-administered medications on file prior to visit.    No Known Allergies  Review of Systems  Review of Systems  Constitutional: Negative for fever and malaise/fatigue.  HENT: Negative for congestion.   Eyes: Negative for discharge.  Respiratory: Negative for shortness of breath.   Cardiovascular: Negative for chest pain, palpitations and leg swelling.  Gastrointestinal: Negative for nausea, abdominal pain and diarrhea.  Genitourinary: Negative for dysuria.  Musculoskeletal: Positive for joint pain. Negative for falls.       Finger pain R 2nd, 3rd with swelling  Skin: Negative for rash.  Neurological: Negative for loss of consciousness and headaches.  Endo/Heme/Allergies: Negative for polydipsia.  Psychiatric/Behavioral: Negative for depression and suicidal ideas. The patient is not nervous/anxious and does not have insomnia.     Objective  BP 140/88  Pulse 93  Temp(Src) 98.1 F (36.7 C) (Oral)  Ht 5\' 5"  (1.651 m)  Wt 156 lb (70.761 kg)  BMI 25.96 kg/m2  SpO2 99%  Physical Exam  Physical Exam  Constitutional: She is  oriented to person, place, and time and well-developed, well-nourished, and in no distress. No distress.  HENT:  Head: Normocephalic and atraumatic.  Eyes: Conjunctivae are normal.  Neck: Neck supple. No thyromegaly present.  Cardiovascular: Normal rate, regular rhythm and normal heart sounds.   No murmur heard. Pulmonary/Chest: Effort normal and breath sounds normal. She has no wheezes.  Abdominal: She exhibits no distension and no mass.  Musculoskeletal: She exhibits edema and tenderness.  Pain and swelling over DIP joints right hand 2nd and 3 rd finger decreased ROM  Lymphadenopathy:    She has no cervical adenopathy.  Neurological: She is alert and oriented to person, place, and time.  Skin: Skin is warm and dry. No rash noted. She is not diaphoretic.  Psychiatric: Memory, affect and judgment normal.    Lab Results  Component Value Date   TSH 2.079 06/18/2012   Lab Results  Component Value Date   WBC 5.4 06/18/2012   HGB 12.4 06/18/2012   HCT 37.3 06/18/2012   MCV 94.0 06/18/2012   PLT 306 06/18/2012   Lab Results  Component Value Date   CREATININE 1.05 05/21/2012   BUN 24* 05/21/2012   NA 138 05/21/2012   K 4.2 05/21/2012   CL 103 05/21/2012   CO2 25 05/21/2012   Lab Results  Component Value Date   ALT 16 06/18/2012   AST 16 06/18/2012   ALKPHOS 51 06/18/2012   BILITOT 0.4 06/18/2012   Lab Results  Component Value Date   CHOL 241* 06/18/2012   Lab Results  Component Value Date   HDL 53 06/18/2012   Lab Results  Component Value Date   LDLCALC 168* 06/18/2012   Lab Results  Component Value Date   TRIG 102 06/18/2012   Lab Results  Component Value Date   CHOLHDL 4.5 06/18/2012     Assessment & Plan  HTN (hypertension) Improved with recheck, no changes  Finger fracture, right reinjured 2nd and 3rd fingers earlier this week by banging them on a hard surface, pain is not improving and she is having trouble sleeping. Question whether secondary gout  has set in. Patient has to get back to work today but is given an order to have her Uric acid level checked on 10/13 if pain is still significant. Encouraged ice and Salon Pas tid, rest. Given some hydrocodone to use prn and a medrol dosepak for the the pain

## 2013-06-03 NOTE — Patient Instructions (Signed)
Ice 15 minutes 3 x a day Salon Pas cream as needed   Finger Sprain A finger sprain is a tear in one of the strong, fibrous tissues that connect the bones (ligaments) in your finger. The severity of the sprain depends on how much of the ligament is torn. The tear can be either partial or complete. CAUSES  Often, sprains are a result of a fall or accident. If you extend your hands to catch an object or to protect yourself, the force of the impact causes the fibers of your ligament to stretch too much. This excess tension causes the fibers of your ligament to tear. SYMPTOMS  You may have some loss of motion in your finger. Other symptoms include:  Bruising.  Tenderness.  Swelling. DIAGNOSIS  In order to diagnose finger sprain, your caregiver will physically examine your finger or thumb to determine how torn the ligament is. Your caregiver may also suggest an X-ray exam of your finger to make sure no bones are broken. TREATMENT  If your ligament is only partially torn, treatment usually involves keeping the finger in a fixed position (immobilization) for a short period. To do this, your caregiver will apply a bandage, cast, or splint to keep your finger from moving until it heals. For a partially torn ligament, the healing process usually takes 2 to 3 weeks. If your ligament is completely torn, you may need surgery to reconnect the ligament to the bone. After surgery a cast or splint will be applied and will need to stay on your finger or thumb for 4 to 6 weeks while your ligament heals. HOME CARE INSTRUCTIONS  Keep your injured finger elevated, when possible, to decrease swelling.  To ease pain and swelling, apply ice to your joint twice a day, for 2 to 3 days:  Put ice in a plastic bag.  Place a towel between your skin and the bag.  Leave the ice on for 15 minutes.  Only take over-the-counter or prescription medicine for pain as directed by your caregiver.  Do not wear rings on your  injured finger.  Do not leave your finger unprotected until pain and stiffness go away (usually 3 to 4 weeks).  Do not allow your cast or splint to get wet. Cover your cast or splint with a plastic bag when you shower or bathe. Do not swim.  Your caregiver may suggest special exercises for you to do during your recovery to prevent or limit permanent stiffness. SEEK IMMEDIATE MEDICAL CARE IF:  Your cast or splint becomes damaged.  Your pain becomes worse rather than better. MAKE SURE YOU:  Understand these instructions.  Will watch your condition.  Will get help right away if you are not doing well or get worse. Document Released: 09/18/2004 Document Revised: 11/03/2011 Document Reviewed: 04/14/2011 [facility] Patient Information 2014 [place], [place].

## 2013-06-20 ENCOUNTER — Ambulatory Visit: Payer: Managed Care, Other (non HMO) | Admitting: Family

## 2013-06-22 ENCOUNTER — Ambulatory Visit (INDEPENDENT_AMBULATORY_CARE_PROVIDER_SITE_OTHER): Payer: Managed Care, Other (non HMO) | Admitting: Family

## 2013-06-22 ENCOUNTER — Encounter: Payer: Self-pay | Admitting: Family

## 2013-06-22 VITALS — BP 110/78 | HR 67 | Temp 97.7°F | Resp 16 | Ht 65.0 in | Wt 154.0 lb

## 2013-06-22 DIAGNOSIS — F418 Other specified anxiety disorders: Secondary | ICD-10-CM

## 2013-06-22 DIAGNOSIS — F341 Dysthymic disorder: Secondary | ICD-10-CM

## 2013-06-22 DIAGNOSIS — I10 Essential (primary) hypertension: Secondary | ICD-10-CM

## 2013-06-22 LAB — BASIC METABOLIC PANEL
CO2: 27 mEq/L (ref 19–32)
Chloride: 106 mEq/L (ref 96–112)
Glucose, Bld: 96 mg/dL (ref 70–99)
Potassium: 4.3 mEq/L (ref 3.5–5.3)
Sodium: 139 mEq/L (ref 135–145)

## 2013-06-22 MED ORDER — OLMESARTAN-AMLODIPINE-HCTZ 20-5-12.5 MG PO TABS: ORAL_TABLET | ORAL | Status: DC

## 2013-06-22 MED ORDER — BUPROPION HCL 75 MG PO TABS: 75.0000 mg | ORAL_TABLET | Freq: Two times a day (BID) | ORAL | Status: DC

## 2013-06-22 NOTE — Patient Instructions (Signed)
Please continue current dose of wellbutrin. Complete lab work prior to leaving. Continue to work towards quitting smoking completely. Follow up in 3 months for a fasting physical.

## 2013-06-22 NOTE — Progress Notes (Signed)
Subjective:    Patient ID: Tiffany May, female    DOB: [birth year], 52 y.o.   MRN: [id number]  HPI Tiffany May is a 52 y/o female who presents today for follow up of depression.  Patient started on Wellbutrin last visit in place of citalopram due to somnolence on citalopram. Patient reports she feels like her "old self" again. Denies sadness and tearfulness, denies suicidal and homicidal ideation.  Patient reports a decrease in cigarette use from 2 packs of cigarettes per day to 5 cigarettes per day.  Patient reports an increase in activity level and is more social with her friends and family.   Review of Systems  Constitutional: Positive for activity change and appetite change. Negative for fever.       Positive change, patient reports more energy and is more active socially.  Reports an increase in appetite.   Respiratory: Negative for shortness of breath.   Cardiovascular: Negative for chest pain.  Gastrointestinal: Negative for nausea, diarrhea and constipation.  Neurological: Negative for dizziness and headaches.  Psychiatric/Behavioral: Negative for suicidal ideas, sleep disturbance and agitation. The patient is not nervous/anxious.    Past Medical History  Diagnosis Date  . Hypertension   . History of chicken pox   . Glaucoma   . Vitamin D deficiency 03/16/2012  . Hyperlipidemia 06/22/2012    History   Social History  . Marital Status: Single    Spouse Name: N/A    Number of Children: 1  . Years of Education: N/A   Occupational History  .  [employer]   Social History Main Topics  . Smoking status: Current Every Day Smoker -- 2.50 packs/day    Types: Cigarettes  . Smokeless tobacco: Not on file  . Alcohol Use: 0.6 oz/week    1 Cans of beer per week     Comment: per day  . Drug Use: Yes    Special: Marijuana  . Sexual Activity: Yes    Birth Control/ Protection: Surgical   Other Topics Concern  . Not on file   Social History Narrative   Caffeine Use:  1 soda  daily   Regular exercise:  3 x weekly, walks dogs every day.   Uses marijuana on weekends.   She quit smoking cigarettes yesterday.     Community education officer- customer service   Complete college   Lives with son age 26, only child             Past Surgical History  Procedure Laterality Date  . Abdominal hysterectomy  2008  . Cesarean section      Family History  Problem Relation Age of Onset  . Heart disease Mother     pacemaker  . Arthritis Maternal Grandmother   . Hypertension Maternal Grandmother   . Cancer Cousin     breast  . Cancer Cousin     colon  . Cancer Cousin     colon  . Cancer Cousin     colon    No Known Allergies  Current Outpatient Prescriptions on File Prior to Visit  Medication Sig Dispense Refill  . buPROPion (WELLBUTRIN) 75 MG tablet One tablet by mouth daily for 3 days, then increase to twice daily  60 tablet  0  . HYDROcodone-acetaminophen (NORCO) 5-325 MG per tablet Take 1 tablet by mouth every 6 (six) hours as needed for pain.  40 tablet  0  . [DISCONTINUED] olmesartan-hydrochlorothiazide (BENICAR HCT) 20-12.5 MG per tablet Take 1 tablet by mouth daily.  21 tablet  0   No current facility-administered medications on file prior to visit.    BP 110/78  Pulse 67  Temp(Src) 97.7 F (36.5 C) (Oral)  Resp 16  Ht 5\' 5"  (1.651 m)  Wt 154 lb 0.6 oz (69.872 kg)  BMI 25.63 kg/m2  SpO2 98%       Objective:   Physical Exam  Constitutional: She is oriented to person, place, and time. She appears well-nourished.  HENT:  Head: Normocephalic.  Cardiovascular: Normal rate and regular rhythm.   Pulmonary/Chest: Effort normal and breath sounds normal. No respiratory distress. She has no wheezes.  Neurological: She is alert and oriented to person, place, and time.  Skin: Skin is warm and dry.  Psychiatric: She has a normal mood and affect. Her behavior is normal.          Assessment & Plan:

## 2013-06-22 NOTE — Assessment & Plan Note (Signed)
Patient appears to be responding well to Wellbutrin.  Continue Wellbutrin, follow up in three months.

## 2013-06-22 NOTE — Assessment & Plan Note (Addendum)
Blood pressure 110/78 today.   Refilled Tribenzor and continue.  Obtain BMET today, follow up in three months for fasting physical.

## 2013-06-27 ENCOUNTER — Encounter: Payer: Self-pay | Admitting: Family

## 2013-07-29 ENCOUNTER — Encounter: Payer: Self-pay | Admitting: Family

## 2013-09-02 ENCOUNTER — Emergency Department (HOSPITAL_BASED_OUTPATIENT_CLINIC_OR_DEPARTMENT_OTHER)
Admission: EM | Admit: 2013-09-02 | Discharge: 2013-09-02 | Disposition: A | Discharge status: Home / Self Care | Payer: Managed Care, Other (non HMO) | Attending: Emergency Medicine | Admitting: Emergency Medicine

## 2013-09-02 ENCOUNTER — Encounter (HOSPITAL_BASED_OUTPATIENT_CLINIC_OR_DEPARTMENT_OTHER): Payer: Self-pay | Admitting: Emergency Medicine

## 2013-09-02 ENCOUNTER — Telehealth: Payer: Self-pay | Admitting: Family

## 2013-09-02 DIAGNOSIS — Z862 Personal history of diseases of the blood and blood-forming organs and certain disorders involving the immune mechanism: Secondary | ICD-10-CM | POA: Insufficient documentation

## 2013-09-02 DIAGNOSIS — I10 Essential (primary) hypertension: Secondary | ICD-10-CM | POA: Insufficient documentation

## 2013-09-02 DIAGNOSIS — Z8619 Personal history of other infectious and parasitic diseases: Secondary | ICD-10-CM | POA: Insufficient documentation

## 2013-09-02 DIAGNOSIS — H409 Unspecified glaucoma: Secondary | ICD-10-CM | POA: Insufficient documentation

## 2013-09-02 DIAGNOSIS — Z8639 Personal history of other endocrine, nutritional and metabolic disease: Secondary | ICD-10-CM | POA: Insufficient documentation

## 2013-09-02 DIAGNOSIS — L500 Allergic urticaria: Secondary | ICD-10-CM | POA: Insufficient documentation

## 2013-09-02 DIAGNOSIS — IMO0002 Reserved for concepts with insufficient information to code with codable children: Secondary | ICD-10-CM | POA: Insufficient documentation

## 2013-09-02 DIAGNOSIS — F17200 Nicotine dependence, unspecified, uncomplicated: Secondary | ICD-10-CM | POA: Insufficient documentation

## 2013-09-02 DIAGNOSIS — Z79899 Other long term (current) drug therapy: Secondary | ICD-10-CM | POA: Insufficient documentation

## 2013-09-02 DIAGNOSIS — T7840XA Allergy, unspecified, initial encounter: Secondary | ICD-10-CM

## 2013-09-02 DIAGNOSIS — L509 Urticaria, unspecified: Secondary | ICD-10-CM

## 2013-09-02 MED ORDER — EPINEPHRINE 0.3 MG/0.3ML IJ SOAJ: 0.3000 mg | Freq: Once | INTRAMUSCULAR | Status: AC

## 2013-09-02 MED ORDER — EPINEPHRINE HCL 1 MG/ML IJ SOLN
0.1000 mg | Freq: Once | INTRAMUSCULAR | Status: AC
  Administered 2013-09-02: 0.1 mg via SUBCUTANEOUS
  Filled 2013-09-02: qty 1

## 2013-09-02 MED ORDER — FAMOTIDINE IN NACL 20-0.9 MG/50ML-% IV SOLN
INTRAVENOUS | Status: AC
  Administered 2013-09-02: 11:00:00 20 mg via INTRAVENOUS
  Filled 2013-09-02: qty 50

## 2013-09-02 MED ORDER — METHYLPREDNISOLONE SODIUM SUCC 125 MG IJ SOLR
125.0000 mg | Freq: Once | INTRAMUSCULAR | Status: AC
Start: 2013-09-02 — End: 2013-09-02
  Administered 2013-09-02: 125 mg via INTRAVENOUS
  Filled 2013-09-02: qty 2

## 2013-09-02 MED ORDER — PREDNISONE 10 MG PO TABS: 20.0000 mg | ORAL_TABLET | Freq: Every day | ORAL | Status: AC

## 2013-09-02 MED ORDER — FEXOFENADINE HCL 60 MG PO TABS: 60.0000 mg | ORAL_TABLET | Freq: Two times a day (BID) | ORAL | Status: AC

## 2013-09-02 MED ORDER — FAMOTIDINE IN NACL 20-0.9 MG/50ML-% IV SOLN
20.0000 mg | Freq: Once | INTRAVENOUS | Status: AC
  Administered 2013-09-02: 20 mg via INTRAVENOUS

## 2013-09-02 NOTE — ED Provider Notes (Signed)
CSN: [id number]     Arrival date & time 09/02/13  1058 History   First MD Initiated Contact with Patient 09/02/13 1109     Chief Complaint  Patient presents with  . Allergic Reaction    HPI  Patient presents with an allergic reaction. She has had allergic reactions to unknown antigens in the past. They've given her hives. She is usually taking Benadryl and got better. Today she started feeling itching and noticed red "hives" on her abdomen and chest and extremities. She took for 25 mg Benadryl and drove herself here. No swelling in the throat no strange sensations of tightness in the chest or throat. No syncope or lightheadedness. No wheezing. No new medications. No bites or stings. No new foods. Unknown antigen today.  Past Medical History  Diagnosis Date  . Hypertension   . History of chicken pox   . Glaucoma   . Vitamin D deficiency 03/16/2012  . Hyperlipidemia 06/22/2012   Past Surgical History  Procedure Laterality Date  . Abdominal hysterectomy  2008  . Cesarean section     Family History  Problem Relation Age of Onset  . Heart disease Mother     pacemaker  . Arthritis Maternal Grandmother   . Hypertension Maternal Grandmother   . Cancer Cousin     breast  . Cancer Cousin     colon  . Cancer Cousin     colon  . Cancer Cousin     colon   History  Substance Use Topics  . Smoking status: Current Every Day Smoker -- 2.50 packs/day    Types: Cigarettes  . Smokeless tobacco: Not on file  . Alcohol Use: 0.6 oz/week    1 Cans of beer per week     Comment: per day   OB History   Grav Para Term Preterm Abortions TAB SAB Ect Mult Living                 Review of Systems  Constitutional: Negative for fever, chills, diaphoresis, appetite change and fatigue.  HENT: Negative for mouth sores, sore throat and trouble swallowing.   Eyes: Negative for visual disturbance.  Respiratory: Negative for cough, chest tightness, shortness of breath and wheezing.   Cardiovascular:  Negative for chest pain.  Gastrointestinal: Negative for nausea, vomiting, abdominal pain, diarrhea and abdominal distention.  Endocrine: Negative for polydipsia, polyphagia and polyuria.  Genitourinary: Negative for dysuria, frequency and hematuria.  Musculoskeletal: Negative for gait problem.  Skin: Positive for rash. Negative for color change and pallor.       Itching and hives  Neurological: Negative for dizziness, syncope, light-headedness and headaches.  Hematological: Does not bruise/bleed easily.  Psychiatric/Behavioral: Negative for behavioral problems and confusion.    Allergies  Review of patient's allergies indicates no known allergies.  Home Medications   Current Outpatient Rx  Name  Route  Sig  Dispense  Refill  . buPROPion (WELLBUTRIN) 75 MG tablet      One tablet by mouth daily for 3 days, then increase to twice daily   60 tablet   0   . buPROPion (WELLBUTRIN) 75 MG tablet   Oral   Take 1 tablet (75 mg total) by mouth 2 (two) times daily.   60 tablet   3   . EPINEPHrine (EPIPEN) 0.3 mg/0.3 mL SOAJ injection   Intramuscular   Inject 0.3 mLs (0.3 mg total) into the muscle once.   1 Device   1   . fexofenadine (ALLEGRA) 60 MG  tablet   Oral   Take 1 tablet (60 mg total) by mouth 2 (two) times daily.   30 tablet   1   . HYDROcodone-acetaminophen (NORCO) 5-325 MG per tablet   Oral   Take 1 tablet by mouth every 6 (six) hours as needed for pain.   40 tablet   0   . Olmesartan-Amlodipine-HCTZ (TRIBENZOR) 20-5-12.5 MG TABS      TAKE 1 TABLET BY MOUTH ONCE DAILY   30 tablet   2   . predniSONE (DELTASONE) 10 MG tablet   Oral   Take 2 tablets (20 mg total) by mouth daily.   4 tablet   0    BP 166/76  Pulse 127  Temp(Src) 98.9 F (37.2 C) (Oral)  Resp 16  Ht 5\' 4"  (1.626 m)  Wt 150 lb (68.04 kg)  BMI 25.73 kg/m2  SpO2 98% Physical Exam  Constitutional: She is oriented to person, place, and time. She appears well-developed and well-nourished.  No distress.  She is awake alert. Not anxious.  HENT:  Head: Normocephalic.  No uvular edema or pharyngeal swelling. No stridor.  Eyes: Conjunctivae are normal. Pupils are equal, round, and reactive to light. No scleral icterus.  Neck: Normal range of motion. Neck supple. No thyromegaly present.  Cardiovascular: Normal rate and regular rhythm.  Exam reveals no gallop and no friction rub.   No murmur heard. Pulmonary/Chest: Effort normal and breath sounds normal. No respiratory distress. She has no wheezes. She has no rales.  No wheezing or prolongation.  Abdominal: Soft. Bowel sounds are normal. She exhibits no distension. There is no tenderness. There is no rebound.  Musculoskeletal: Normal range of motion.  Neurological: She is alert and oriented to person, place, and time.  Skin: Skin is warm and dry. No rash noted.  Diffuse urticaria  Psychiatric: She has a normal mood and affect. Her behavior is normal.    ED Course  Procedures (including critical care time) Labs Review Labs Reviewed - No data to display Imaging Review No results found.  EKG Interpretation   None       MDM   1. Allergic reaction, initial encounter   2. Urticaria    She was given Solu-Medrol 125 mg IV. Pepcid 25 mg IV. Subcutaneous epinephrine 1:1000, 0.1 cc intramuscular  Reevaluated several times over the next 2 hours. Her urticaria had resolved. Well oxygenated. Clear lungs. Normal pharyngeal exam. We discharged home. Prescriptions given for labor. Prednisone 48 hours. Urged followup with her primary care physician for allergist referral.    Rolland Porter, MD 09/02/13 1408

## 2013-09-02 NOTE — Discharge Instructions (Signed)
Hives Hives are itchy, red, swollen areas of the skin. They can vary in size and location on your body. Hives can come and go for hours or several days (acute hives) or for several weeks (chronic hives). Hives do not spread from person to person (noncontagious). They may get worse with scratching, exercise, and emotional stress. CAUSES   Allergic reaction to food, additives, or drugs.  Infections, including the common cold.  Illness, such as vasculitis, lupus, or thyroid disease.  Exposure to sunlight, heat, or cold.  Exercise.  Stress.  Contact with chemicals. SYMPTOMS   Red or white swollen patches on the skin. The patches may change size, shape, and location quickly and repeatedly.  Itching.  Swelling of the hands, feet, and face. This may occur if hives develop deeper in the skin. DIAGNOSIS  Your caregiver can usually tell what is wrong by performing a physical exam. Skin or blood tests may also be done to determine the cause of your hives. In some cases, the cause cannot be determined. TREATMENT  Mild cases usually get better with medicines such as antihistamines. Severe cases may require an emergency epinephrine injection. If the cause of your hives is known, treatment includes avoiding that trigger.  HOME CARE INSTRUCTIONS   Avoid causes that trigger your hives.  Take antihistamines as directed by your caregiver to reduce the severity of your hives. Non-sedating or low-sedating antihistamines are usually recommended. Do not drive while taking an antihistamine.  Take any other medicines prescribed for itching as directed by your caregiver.  Wear loose-fitting clothing.  Keep all follow-up appointments as directed by your caregiver. SEEK MEDICAL CARE IF:   You have persistent or severe itching that is not relieved with medicine.  You have painful or swollen joints. SEEK IMMEDIATE MEDICAL CARE IF:   You have a fever.  Your tongue or lips are swollen.  You have  trouble breathing or swallowing.  You feel tightness in the throat or chest.  You have abdominal pain. These problems may be the first sign of a life-threatening allergic reaction. Call your local emergency services (911 in [place] MAKE SURE YOU:   Understand these instructions.  Will watch your condition.  Will get help right away if you are not doing well or get worse. Document Released: 08/11/2005 Document Revised: 02/10/2012 Document Reviewed: 11/04/2011 [facility] Patient Information 2014 [place].  Allergies Allergies may happen from anything your body is sensitive to. This may be food, medicines, pollens, chemicals, and nearly anything around you in everyday life that produces allergens. An allergen is anything that causes an allergy producing substance. Heredity is often a factor in causing these problems. This means you may have some of the same allergies as your parents. Food allergies happen in all age groups. Food allergies are some of the most severe and life threatening. Some common food allergies are cow's milk, seafood, eggs, nuts, wheat, and soybeans. SYMPTOMS   Swelling around the mouth.  An itchy red rash or hives.  Vomiting or diarrhea.  Difficulty breathing. SEVERE ALLERGIC REACTIONS ARE LIFE-THREATENING. This reaction is called anaphylaxis. It can cause the mouth and throat to swell and cause difficulty with breathing and swallowing. In severe reactions only a trace amount of food (for example, peanut oil in a salad) may cause death within seconds. Seasonal allergies occur in all age groups. These are seasonal because they usually occur during the same season every year. They may be a reaction to molds, grass pollens, or tree pollens. Other  causes of problems are house dust mite allergens, pet dander, and mold spores. The symptoms often consist of nasal congestion, a runny itchy nose associated with sneezing, and tearing itchy eyes. There is often an associated  itching of the mouth and ears. The problems happen when you come in contact with pollens and other allergens. Allergens are the particles in the air that the body reacts to with an allergic reaction. This causes you to release allergic antibodies. Through a chain of events, these eventually cause you to release histamine into the blood stream. Although it is meant to be protective to the body, it is this release that causes your discomfort. This is why you were given anti-histamines to feel better. If you are unable to pinpoint the offending allergen, it may be determined by skin or blood testing. Allergies cannot be cured but can be controlled with medicine. Hay fever is a collection of all or some of the seasonal allergy problems. It may often be treated with simple over-the-counter medicine such as diphenhydramine. Take medicine as directed. Do not drink alcohol or drive while taking this medicine. Check with your caregiver or package insert for child dosages. If these medicines are not effective, there are many new medicines your caregiver can prescribe. Stronger medicine such as nasal spray, eye drops, and corticosteroids may be used if the first things you try do not work well. Other treatments such as immunotherapy or desensitizing injections can be used if all else fails. Follow up with your caregiver if problems continue. These seasonal allergies are usually not life threatening. They are generally more of a nuisance that can often be handled using medicine. HOME CARE INSTRUCTIONS   If unsure what causes a reaction, keep a diary of foods eaten and symptoms that follow. Avoid foods that cause reactions.  If hives or rash are present:  Take medicine as directed.  You may use an over-the-counter antihistamine (diphenhydramine) for hives and itching as needed.  Apply cold compresses (cloths) to the skin or take baths in cool water. Avoid hot baths or showers. Heat will make a rash and itching  worse.  If you are severely allergic:  Following a treatment for a severe reaction, hospitalization is often required for closer follow-up.  Wear a medic-alert bracelet or necklace stating the allergy.  You and your family must learn how to give adrenaline or use an anaphylaxis kit.  If you have had a severe reaction, always carry your anaphylaxis kit or EpiPen with you. Use this medicine as directed by your caregiver if a severe reaction is occurring. Failure to do so could have a fatal outcome. SEEK MEDICAL CARE IF:  You suspect a food allergy. Symptoms generally happen within 30 minutes of eating a food.  Your symptoms have not gone away within 2 days or are getting worse.  You develop new symptoms.  You want to retest yourself or your child with a food or drink you think causes an allergic reaction. Never do this if an anaphylactic reaction to that food or drink has happened before. Only do this under the care of a caregiver. SEEK IMMEDIATE MEDICAL CARE IF:   You have difficulty breathing, are wheezing, or have a tight feeling in your chest or throat.  You have a swollen mouth, or you have hives, swelling, or itching all over your body.  You have had a severe reaction that has responded to your anaphylaxis kit or an EpiPen. These reactions may return when the medicine has worn  off. These reactions should be considered life threatening. MAKE SURE YOU:   Understand these instructions.  Will watch your condition.  Will get help right away if you are not doing well or get worse. Document Released: 11/04/2002 Document Revised: 12/06/2012 Document Reviewed: 04/10/2008 [facility] Patient Information 2014 [place].

## 2013-09-02 NOTE — Telephone Encounter (Signed)
Appointment scheduled for 09/13/13

## 2013-09-02 NOTE — Telephone Encounter (Signed)
Please call pt to arrange ED follow up in 1-2 weeks.

## 2013-09-02 NOTE — ED Notes (Signed)
Pt amb to room 11 with quick steady gait, pt is scratching all over her body, hives noted, eyes noted swollen, pt states she has had allergic reactions to unknown allergens in the past "but never this bad...". Pt states sx started within the last 30 minutes and she took 50mg  benadryl po approx 10 min pta.

## 2013-09-13 ENCOUNTER — Ambulatory Visit: Payer: Managed Care, Other (non HMO) | Admitting: Family

## 2013-09-13 DIAGNOSIS — Z0289 Encounter for other administrative examinations: Secondary | ICD-10-CM

## 2013-11-07 ENCOUNTER — Telehealth: Payer: Self-pay | Admitting: Family

## 2013-11-07 ENCOUNTER — Encounter: Payer: Self-pay | Admitting: Family

## 2013-11-07 ENCOUNTER — Ambulatory Visit (INDEPENDENT_AMBULATORY_CARE_PROVIDER_SITE_OTHER): Payer: Managed Care, Other (non HMO) | Admitting: Family

## 2013-11-07 VITALS — BP 130/80 | HR 83 | Temp 98.0°F | Resp 16 | Ht 65.0 in | Wt 158.1 lb

## 2013-11-07 DIAGNOSIS — F341 Dysthymic disorder: Secondary | ICD-10-CM

## 2013-11-07 DIAGNOSIS — I10 Essential (primary) hypertension: Secondary | ICD-10-CM

## 2013-11-07 DIAGNOSIS — F418 Other specified anxiety disorders: Secondary | ICD-10-CM

## 2013-11-07 MED ORDER — AMLODIPINE BESYLATE 5 MG PO TABS: 5.0000 mg | ORAL_TABLET | Freq: Every day | ORAL | Status: DC

## 2013-11-07 MED ORDER — LISINOPRIL 20 MG PO TABS: 20.0000 mg | ORAL_TABLET | Freq: Every day | ORAL | Status: AC

## 2013-11-07 MED ORDER — HYDROCHLOROTHIAZIDE 25 MG PO TABS: 25.0000 mg | ORAL_TABLET | Freq: Every day | ORAL | Status: AC

## 2013-11-07 NOTE — Patient Instructions (Signed)
Stop tribenzor. Start amlodipine, lisinopril and hctz instead. Follow up in 1 week.

## 2013-11-07 NOTE — Progress Notes (Signed)
Pre visit review using our clinic review tool, if applicable. No additional management support is needed unless otherwise documented below in the visit note. 

## 2013-11-07 NOTE — Assessment & Plan Note (Signed)
Stable. D/C tribenzor due to cost. Start amlodipine, hctz, lisinopril instead. Follow up in 1 week for BP check and bmet.

## 2013-11-07 NOTE — Telephone Encounter (Signed)
Relevant patient education assigned to patient using Emmi. ° °

## 2013-11-07 NOTE — Assessment & Plan Note (Signed)
Improved. Stable off of BP meds.

## 2013-11-07 NOTE — Progress Notes (Signed)
Subjective:    Patient ID: Tiffany May, female    DOB: 08-02-1961, 53 y.o.   MRN: [id number]  HPI  Tiffany May is a 53 yr old female who presents today for follow up.  1) Depression- she was last seen in September 2014.  At that time she had a lot of stress with caring for multiple sick family members.  Last visit she was started on wellbutrin.  She reports that she did not take wellbutrin long. She continues to smoke but has cut down to 10 cigarettes a day. Reports feeling very well. Broke up with her boyfriend. Reports that he was a great source of stress. Things have eased up some caring for sick family members.    2) HTN-  She is taking tribenzor.  Denies CP/SOB or swelling. She pays cash for tribenzor and is finding it too expensive.  BP Readings from Last 3 Encounters:  11/07/13 130/80  09/02/13 166/76  06/22/13 110/78    Review of Systems See HPI  Past Medical History  Diagnosis Date  . Hypertension   . History of chicken pox   . Glaucoma   . Vitamin D deficiency 03/16/2012  . Hyperlipidemia 06/22/2012    History   Social History  . Marital Status: Single    Spouse Name: N/A    Number of Children: 1  . Years of Education: N/A   Occupational History  .  [employer]   Social History Main Topics  . Smoking status: Current Every Day Smoker    Types: Cigarettes  . Smokeless tobacco: Not on file     Comment: 10 cigarettes daily  . Alcohol Use: 0.6 oz/week    1 Cans of beer per week     Comment: per day  . Drug Use: Yes    Special: Marijuana  . Sexual Activity: Yes    Birth Control/ Protection: Surgical   Other Topics Concern  . Not on file   Social History Narrative   Caffeine Use:  1 soda daily   Regular exercise:  3 x weekly, walks dogs every day.   Uses marijuana on weekends.   She quit smoking cigarettes yesterday.     [employer]- customer service   Complete college   Lives with son age 51, only child             Past Surgical History  Procedure  Laterality Date  . Abdominal hysterectomy  2008  . Cesarean section      Family History  Problem Relation Age of Onset  . Heart disease Mother     pacemaker  . Arthritis Maternal Grandmother   . Hypertension Maternal Grandmother   . Cancer Cousin     breast  . Cancer Cousin     colon  . Cancer Cousin     colon  . Cancer Cousin     colon    No Known Allergies  Current Outpatient Prescriptions on File Prior to Visit  Medication Sig Dispense Refill  . EPINEPHrine (EPIPEN) 0.3 mg/0.3 mL SOAJ injection Inject 0.3 mLs (0.3 mg total) into the muscle once.  1 Device  1  . fexofenadine (ALLEGRA) 60 MG tablet Take 1 tablet (60 mg total) by mouth 2 (two) times daily.  30 tablet  1  . HYDROcodone-acetaminophen (NORCO) 5-325 MG per tablet Take 1 tablet by mouth every 6 (six) hours as needed for pain.  40 tablet  0  . [DISCONTINUED] olmesartan-hydrochlorothiazide (BENICAR HCT) 20-12.5 MG per tablet Take 1  tablet by mouth daily.  21 tablet  0   No current facility-administered medications on file prior to visit.    BP 130/80  Pulse 83  Temp(Src) 98 F (36.7 C) (Oral)  Resp 16  Ht 5\' 5"  (1.651 m)  Wt 158 lb 1.9 oz (71.723 kg)  BMI 26.31 kg/m2  SpO2 99%       Objective:   Physical Exam  Constitutional: She is oriented to person, place, and time. She appears well-developed and well-nourished. No distress.  Cardiovascular: Normal rate and regular rhythm.   No murmur heard. Pulmonary/Chest: Effort normal and breath sounds normal. No respiratory distress. She has no wheezes. She has no rales. She exhibits no tenderness.  Musculoskeletal: She exhibits no edema.  Neurological: She is alert and oriented to person, place, and time.  Psychiatric: She has a normal mood and affect. Her behavior is normal. Judgment and thought content normal.          Assessment & Plan:

## 2013-11-08 ENCOUNTER — Telehealth: Payer: Self-pay | Admitting: Family

## 2013-11-08 NOTE — Telephone Encounter (Signed)
Relevant patient education assigned to patient using Emmi. ° °

## 2013-11-16 ENCOUNTER — Ambulatory Visit: Payer: Managed Care, Other (non HMO) | Admitting: Family Medicine

## 2013-12-05 ENCOUNTER — Ambulatory Visit: Payer: Managed Care, Other (non HMO) | Admitting: Family

## 2013-12-05 DIAGNOSIS — Z0289 Encounter for other administrative examinations: Secondary | ICD-10-CM

## 2020-03-09 ENCOUNTER — Emergency Department (HOSPITAL_BASED_OUTPATIENT_CLINIC_OR_DEPARTMENT_OTHER)
Admission: EM | Admit: 2020-03-09 | Discharge: 2020-03-09 | Discharge status: Against Medical Advice | Payer: No Typology Code available for payment source | Attending: Emergency Medicine | Admitting: Emergency Medicine

## 2020-03-09 ENCOUNTER — Encounter (HOSPITAL_BASED_OUTPATIENT_CLINIC_OR_DEPARTMENT_OTHER): Payer: Self-pay | Admitting: *Deleted

## 2020-03-09 ENCOUNTER — Other Ambulatory Visit: Payer: Self-pay

## 2020-03-09 ENCOUNTER — Emergency Department (HOSPITAL_BASED_OUTPATIENT_CLINIC_OR_DEPARTMENT_OTHER): Payer: No Typology Code available for payment source

## 2020-03-09 DIAGNOSIS — Z5321 Procedure and treatment not carried out due to patient leaving prior to being seen by health care provider: Secondary | ICD-10-CM | POA: Insufficient documentation

## 2020-03-09 DIAGNOSIS — M542 Cervicalgia: Secondary | ICD-10-CM | POA: Diagnosis not present

## 2020-03-09 DIAGNOSIS — M545 Low back pain, unspecified: Secondary | ICD-10-CM

## 2020-03-09 DIAGNOSIS — S161XXA Strain of muscle, fascia and tendon at neck level, initial encounter: Secondary | ICD-10-CM

## 2020-03-09 MED ORDER — AMLODIPINE BESYLATE 5 MG PO TABS
5.0000 mg | ORAL_TABLET | Freq: Once | ORAL | Status: AC
  Administered 2020-03-09: 17:00:00 5 mg via ORAL
  Filled 2020-03-09: qty 1

## 2020-03-09 MED ORDER — AMLODIPINE BESYLATE 5 MG PO TABS
5.0000 mg | ORAL_TABLET | Freq: Every day | ORAL | 0 refills | Status: AC
Start: 2020-03-09 — End: ?
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
## 2020-03-09 NOTE — ED Notes (Signed)
Pt called again, no response.  

## 2020-03-09 NOTE — Discharge Instructions (Signed)
Take 4 over the counter ibuprofen tablets 3 times a day or 2 over-the-counter naproxen tablets twice a day for pain. Also take tylenol 1000mg(2 extra strength) four times a day.    
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
## 2020-03-09 NOTE — ED Notes (Signed)
Pt not in stretcher for bp recheck. Pt called in restrooms and waiting area, no response.

## 2020-03-09 NOTE — ED Notes (Signed)
Pt called for recheck of bp, no response. Called in restrooms and waiting area.

## 2023-10-25 ENCOUNTER — Other Ambulatory Visit (HOSPITAL_BASED_OUTPATIENT_CLINIC_OR_DEPARTMENT_OTHER): Payer: Self-pay | Admitting: Physician Assistant

## 2023-10-25 DIAGNOSIS — Z1231 Encounter for screening mammogram for malignant neoplasm of breast: Secondary | ICD-10-CM

## 2023-10-26 ENCOUNTER — Telehealth (HOSPITAL_BASED_OUTPATIENT_CLINIC_OR_DEPARTMENT_OTHER): Payer: Self-pay
